# Patient Record
Sex: Female | Born: 1975 | Race: White | Hispanic: No | Smoking: Former smoker
Health system: Southern US, Community
[De-identification: ages and names within clinical notes are randomized; demographics above are authoritative.]

## PROBLEM LIST (undated history)

## (undated) DIAGNOSIS — E079 Disorder of thyroid, unspecified: Secondary | ICD-10-CM

## (undated) DIAGNOSIS — K5790 Diverticulosis of intestine, part unspecified, without perforation or abscess without bleeding: Secondary | ICD-10-CM

## (undated) DIAGNOSIS — Z5189 Encounter for other specified aftercare: Secondary | ICD-10-CM

## (undated) HISTORY — PX: CHOLECYSTECTOMY: SHX55

---

## 2019-08-15 ENCOUNTER — Other Ambulatory Visit: Payer: Self-pay

## 2019-08-15 ENCOUNTER — Encounter (HOSPITAL_COMMUNITY): Payer: Self-pay | Admitting: Emergency Medicine

## 2019-08-15 DIAGNOSIS — R109 Unspecified abdominal pain: Secondary | ICD-10-CM | POA: Insufficient documentation

## 2019-08-15 DIAGNOSIS — Z5321 Procedure and treatment not carried out due to patient leaving prior to being seen by health care provider: Secondary | ICD-10-CM | POA: Diagnosis not present

## 2019-08-15 NOTE — ED Triage Notes (Addendum)
Patient c/o left side abdominal pain today. Reports pain radiating to lower back. Reports nausea, denies V/D. Denies urinary sx. Reports hx fibroid on left ovary.

## 2019-08-16 ENCOUNTER — Emergency Department (HOSPITAL_COMMUNITY)
Admission: EM | Admit: 2019-08-16 | Discharge: 2019-08-16 | Disposition: A | Discharge status: Home / Self Care | Payer: 59 | Attending: Emergency Medicine | Admitting: Emergency Medicine

## 2019-08-16 HISTORY — DX: Encounter for other specified aftercare: Z51.89

## 2019-08-16 HISTORY — DX: Diverticulosis of intestine, part unspecified, without perforation or abscess without bleeding: K57.90

## 2019-08-16 HISTORY — DX: Disorder of thyroid, unspecified: E07.9

## 2019-08-16 NOTE — ED Notes (Signed)
Pt states that she is going to [facility] because, " this is ridiculous."

## 2020-04-03 ENCOUNTER — Other Ambulatory Visit: Payer: Self-pay

## 2020-04-03 ENCOUNTER — Encounter (HOSPITAL_COMMUNITY): Payer: Self-pay | Admitting: Emergency Medicine

## 2020-04-03 ENCOUNTER — Emergency Department (HOSPITAL_COMMUNITY)
Admission: EM | Admit: 2020-04-03 | Discharge: 2020-04-03 | Disposition: A | Discharge status: Home / Self Care | Payer: 59 | Attending: Emergency Medicine | Admitting: Emergency Medicine

## 2020-04-03 ENCOUNTER — Emergency Department (HOSPITAL_COMMUNITY): Payer: 59

## 2020-04-03 DIAGNOSIS — R072 Precordial pain: Secondary | ICD-10-CM

## 2020-04-03 DIAGNOSIS — R2243 Localized swelling, mass and lump, lower limb, bilateral: Secondary | ICD-10-CM | POA: Insufficient documentation

## 2020-04-03 DIAGNOSIS — F17290 Nicotine dependence, other tobacco product, uncomplicated: Secondary | ICD-10-CM | POA: Insufficient documentation

## 2020-04-03 DIAGNOSIS — E079 Disorder of thyroid, unspecified: Secondary | ICD-10-CM | POA: Diagnosis not present

## 2020-04-03 DIAGNOSIS — Z923 Personal history of irradiation: Secondary | ICD-10-CM | POA: Diagnosis not present

## 2020-04-03 DIAGNOSIS — M542 Cervicalgia: Secondary | ICD-10-CM | POA: Diagnosis not present

## 2020-04-03 DIAGNOSIS — R0789 Other chest pain: Secondary | ICD-10-CM | POA: Insufficient documentation

## 2020-04-03 DIAGNOSIS — M79602 Pain in left arm: Secondary | ICD-10-CM | POA: Insufficient documentation

## 2020-04-03 LAB — COMPREHENSIVE METABOLIC PANEL
ALT: 12 U/L (ref 0–44)
AST: 21 U/L (ref 15–41)
Albumin: 3.4 g/dL — ABNORMAL LOW (ref 3.5–5.0)
Alkaline Phosphatase: 58 U/L (ref 38–126)
Anion gap: 8 (ref 5–15)
BUN: 7 mg/dL (ref 6–20)
CO2: 22 mmol/L (ref 22–32)
Calcium: 8.3 mg/dL — ABNORMAL LOW (ref 8.9–10.3)
Chloride: 109 mmol/L (ref 98–111)
Creatinine, Ser: 0.65 mg/dL (ref 0.44–1.00)
GFR calc Af Amer: >60 mL/min (ref >60)
GFR calc non Af Amer: >60 mL/min (ref >60)
Glucose, Bld: 101 mg/dL — ABNORMAL HIGH (ref 70–99)
Potassium: 3.6 mmol/L (ref 3.5–5.1)
Sodium: 139 mmol/L (ref 135–145)
Total Bilirubin: 0.4 mg/dL (ref 0.3–1.2)
Total Protein: 6.1 g/dL — ABNORMAL LOW (ref 6.5–8.1)

## 2020-04-03 LAB — CBC WITH DIFFERENTIAL/PLATELET
Abs Immature Granulocytes: 0.02 10*3/uL (ref 0.00–0.07)
Basophils Absolute: 0 10*3/uL (ref 0.0–0.1)
Basophils Relative: 0 %
Eosinophils Absolute: 0.1 10*3/uL (ref 0.0–0.5)
Eosinophils Relative: 1 %
HCT: 37 % (ref 36.0–46.0)
Hemoglobin: 11.5 g/dL — ABNORMAL LOW (ref 12.0–15.0)
Immature Granulocytes: 0 %
Lymphocytes Relative: 14 %
Lymphs Abs: 0.9 10*3/uL (ref 0.7–4.0)
MCH: 26.8 pg (ref 26.0–34.0)
MCHC: 31.1 g/dL (ref 30.0–36.0)
MCV: 86.2 fL (ref 80.0–100.0)
Monocytes Absolute: 0.3 10*3/uL (ref 0.1–1.0)
Monocytes Relative: 4 %
Neutro Abs: 5.2 10*3/uL (ref 1.7–7.7)
Neutrophils Relative %: 81 %
Platelets: 262 10*3/uL (ref 150–400)
RBC: 4.29 MIL/uL (ref 3.87–5.11)
RDW: 14 % (ref 11.5–15.5)
WBC: 6.5 10*3/uL (ref 4.0–10.5)
nRBC: 0 % (ref 0.0–0.2)

## 2020-04-03 LAB — TROPONIN I (HIGH SENSITIVITY)
Troponin I (High Sensitivity): 3 ng/L (ref <18)
Troponin I (High Sensitivity): 7 ng/L (ref <18)

## 2020-04-03 LAB — D-DIMER, QUANTITATIVE: D-Dimer, Quant: 0.36 ug/mL-FEU (ref 0.00–0.50)

## 2020-04-03 LAB — I-STAT BETA HCG BLOOD, ED (MC, WL, AP ONLY): I-stat hCG, quantitative: <5 m[IU]/mL (ref <5)

## 2020-04-03 MED ORDER — OMEPRAZOLE 20 MG PO CPDR: 20.0000 mg | DELAYED_RELEASE_CAPSULE | Freq: Every day | ORAL | 0 refills | Status: DC

## 2020-04-03 MED ORDER — NITROGLYCERIN 0.4 MG SL SUBL
0.4000 mg | SUBLINGUAL_TABLET | SUBLINGUAL | Status: DC | PRN
  Administered 2020-04-03: 11:00:00 0.4 mg via SUBLINGUAL
  Filled 2020-04-03: qty 1

## 2020-04-03 NOTE — ED Notes (Signed)
Patient verbalizes understanding of discharge instructions. Opportunity for questioning and answers were provided. Armband removed by staff, pt discharged from ED ambulatory.   

## 2020-04-03 NOTE — ED Triage Notes (Signed)
To ED via [facility] from work, developed chest pain - across chest, radiating to neck,  Received ASA 324mg  and NTG 1 SL per EMS--  States she had episode of "sweating" Sunday night and Monday morning. Had Phizer vaccine a week ago Monday--  Hx of hyperthyroidism with radiation--

## 2020-04-03 NOTE — ED Provider Notes (Signed)
[facility] EMERGENCY DEPARTMENT Provider Note   CSN: [id number] Arrival date & time: 04/03/20  1016     History Chief Complaint  Patient presents with  . Chest Pain    Alice Greene is a 44 y.o. female.  The history is provided by the patient. No language interpreter was used.  Chest Pain  Alice Greene is a 44 y.o. female who presents to the Emergency Department complaining of chest pain. She presents the emergency department by EMS complaining of central chest pain that began upon waking around six this morning. Pain is described as a constant pressure type sensation that radiates to her left arm in her neck. She thought it might've been indigestion. She has associated diaphoresis. She also woke up about five days ago covered in sweat. She reports increased bilateral lower extremity edema for the last few days. No reports of fever, nausea, vomiting, abdominal pain, diarrhea, shortness of breath. She attempted to go to work with her chest pain but the pain persisted and EMS was called. She received 324 of aspirin as well as one nitroglycerin prior to ED arrival. She states that she did have partial improvement in her symptoms with the aspirin. She has a history of thyroid disease, status post radiation. She does not take any current medications. She vape's. No alcohol or drug use. She has a family history of cardiac disease and her mother at a young age. No personal or family history of blood clots.    Past Medical History:  Diagnosis Date  . Blood transfusion without reported diagnosis   . Diverticulosis   . Thyroid disease   graves disease, cardiomyopathy  There are no problems to display for this patient.      OB History   No obstetric history on file.     No family history on file.  Social History   Tobacco Use  . Smoking status: Former Research scientist (life sciences)  . Smokeless tobacco: Never Used  Substance Use Topics  . Alcohol use: Not Currently  .  Drug use: Never    Home Medications Prior to Admission medications   Medication Sig Start Date End Date Taking? Authorizing Provider  ibuprofen (ADVIL) 200 MG tablet Take 800 mg by mouth every 6 (six) hours as needed for headache or moderate pain.   Yes [provider]  omeprazole (PRILOSEC) 20 MG capsule Take 1 capsule (20 mg total) by mouth daily. 04/03/20   Quintella Reichert, MD    Allergies    Hydrocodone, Propoxyphene, and Codeine  Review of Systems   Review of Systems  Cardiovascular: Positive for chest pain.  All other systems reviewed and are negative.   Physical Exam Updated Vital Signs BP 117/79   Pulse (!) 51   Temp 98.4 F (36.9 C) (Oral)   Resp 15   Ht 5\' 3"  (1.6 m)   Wt 58.1 kg   LMP 03/28/2020 (Exact Date)   SpO2 100%   BMI 22.67 kg/m   Physical Exam Vitals and nursing note reviewed.  Constitutional:      Appearance: She is well-developed.  HENT:     Head: Normocephalic and atraumatic.  Cardiovascular:     Rate and Rhythm: Normal rate and regular rhythm.     Heart sounds: No murmur.  Pulmonary:     Effort: Pulmonary effort is normal. No respiratory distress.     Breath sounds: Normal breath sounds.  Abdominal:     Palpations: Abdomen is soft.     Tenderness:  There is no abdominal tenderness. There is no guarding or rebound.  Musculoskeletal:        General: No swelling or tenderness.  Skin:    General: Skin is warm and dry.  Neurological:     Mental Status: She is alert and oriented to person, place, and time.  Psychiatric:        Behavior: Behavior normal.     ED Results / Procedures / Treatments   Labs (all labs ordered are listed, but only abnormal results are displayed) Labs Reviewed  COMPREHENSIVE METABOLIC PANEL - Abnormal; Notable for the following components:      Result Value   Glucose, Bld 101 (*)    Calcium 8.3 (*)    Total Protein 6.1 (*)    Albumin 3.4 (*)    All other components within normal limits  CBC WITH  DIFFERENTIAL/PLATELET - Abnormal; Notable for the following components:   Hemoglobin 11.5 (*)    All other components within normal limits  D-DIMER, QUANTITATIVE (NOT AT [facility])  I-STAT BETA HCG BLOOD, ED ([facility], [facility], [facility] ONLY)  TROPONIN I (HIGH SENSITIVITY)  TROPONIN I (HIGH SENSITIVITY)    EKG EKG Interpretation  Date/Time:  Friday April 03 2020 10:18:18 EDT Ventricular Rate:  66 PR Interval:    QRS Duration: 97 QT Interval:  402 QTC Calculation: 422 R Axis:   69 Text Interpretation: Sinus rhythm RSR' in V1 or V2, probably normal variant Nonspecific T abnrm, anterolateral leads no prior available for comparison Confirmed by Quintella Reichert [id number]) on 04/03/2020 10:29:53 AM   Radiology DG Chest 2 View  Result Date: 04/03/2020 CLINICAL DATA:  Chest pain EXAM: CHEST - 2 VIEW COMPARISON:  None. FINDINGS: The heart size and mediastinal contours are within normal limits. Both lungs are clear. The visualized skeletal structures are unremarkable. IMPRESSION: No active cardiopulmonary disease. Electronically Signed   By: Davina Poke D.O.   On: 04/03/2020 11:07    Procedures Procedures (including critical care time)  Medications Ordered in ED Medications  nitroGLYCERIN (NITROSTAT) SL tablet 0.4 mg (0.4 mg Sublingual Given 04/03/20 1101)    ED Course  I have reviewed the triage vital signs and the nursing notes.  Pertinent labs & imaging results that were available during my care of the patient were reviewed by me and considered in my medical decision making (see chart for details).    MDM Rules/Calculators/A&P                     Patient here for evaluation of chest pressure that started today. She has a family history of coronary artery disease. EKG without acute ischemic changes in troponin is negative times two. Presentation is not consistent with PE, D dimer is negative. Doubt dissection, pneumonia. Patient is feeling improved on recheck. Discussed with Dr. Terrence Dupont, with  cardiology. Plan to have her follow-up on Monday for further cardiac evaluation given her risk factors. Counseled patient on home care for chest pain, outpatient follow-up and return precautions.  Final Clinical Impression(s) / ED Diagnoses Final diagnoses:  Precordial pain    Rx / DC Orders ED Discharge Orders         Ordered    omeprazole (PRILOSEC) 20 MG capsule  Daily     04/03/20 1331           Quintella Reichert, MD 04/03/20 1334

## 2021-07-09 ENCOUNTER — Ambulatory Visit
Admission: EM | Admit: 2021-07-09 | Discharge: 2021-07-09 | Disposition: A | Discharge status: Home / Self Care | Payer: 59 | Attending: Emergency Medicine | Admitting: Emergency Medicine

## 2021-07-09 ENCOUNTER — Encounter: Payer: Self-pay | Admitting: Emergency Medicine

## 2021-07-09 ENCOUNTER — Other Ambulatory Visit: Payer: Self-pay

## 2021-07-09 DIAGNOSIS — R52 Pain, unspecified: Secondary | ICD-10-CM

## 2021-07-09 DIAGNOSIS — J069 Acute upper respiratory infection, unspecified: Secondary | ICD-10-CM

## 2021-07-09 MED ORDER — BENZONATATE 200 MG PO CAPS: 200.0000 mg | ORAL_CAPSULE | Freq: Three times a day (TID) | ORAL | 0 refills | Status: AC | PRN

## 2021-07-09 MED ORDER — FLUTICASONE PROPIONATE 50 MCG/ACT NA SUSP: 1.0000 | Freq: Every day | NASAL | 0 refills | Status: DC

## 2021-07-09 MED ORDER — IBUPROFEN 800 MG PO TABS: 800.0000 mg | ORAL_TABLET | Freq: Three times a day (TID) | ORAL | 0 refills | Status: DC

## 2021-07-09 NOTE — ED Triage Notes (Signed)
For a few days pt has had body aches, headache, and eye drainage.  Had fever today.

## 2021-07-09 NOTE — Discharge Instructions (Addendum)
COVID/flu test pending Flonase to help with sinus pressure/inflammation May use over-the-counter Mucinex or Sudafed to further relieve congestion/pressure Continue Tylenol and ibuprofen for headaches, body aches, fevers Rest and fluids Developing more of cough May use Tessalon or over-the-counter Delsym or Robitussin, Dimetapp Follow-up if not improving or worsening

## 2021-07-09 NOTE — ED Provider Notes (Signed)
[facility]    CSN: [id number] Arrival date & time: 07/09/21  1512      History   Chief Complaint Chief Complaint  Patient presents with   URI    HPI Alice Greene is a 45 y.o. female presenting today for evaluation of URI symptoms.  Reports associated fevers, headaches, body aches as well as eye drainage.  Symptoms began over the past 2 to 3 days.  No known COVID exposure.  Using OTC meds without relief.  Reports eyes slightly swollen and draining yesterday, but denies significant eye symptoms today.  Denies change in vision.  Increased headaches can frontal area with sinus pressure.  HPI  Past Medical History:  Diagnosis Date   Blood transfusion without reported diagnosis    Diverticulosis    Thyroid disease     There are no problems to display for this patient.   Past Surgical History:  Procedure Laterality Date   CHOLECYSTECTOMY      OB History   No obstetric history on file.      Home Medications    Prior to Admission medications   Medication Sig Start Date End Date Taking? Authorizing Provider  benzonatate (TESSALON) 200 MG capsule Take 1 capsule (200 mg total) by mouth 3 (three) times daily as needed for up to 7 days for cough. 07/09/21 07/16/21 Yes Telicia Hodgkiss C, PA-C  fluticasone (FLONASE) 50 MCG/ACT nasal spray Place 1-2 sprays into both nostrils daily. 07/09/21  Yes Marquavion Venhuizen C, PA-C  ibuprofen (ADVIL) 800 MG tablet Take 1 tablet (800 mg total) by mouth 3 (three) times daily. 07/09/21  Yes Suhaila Troiano C, PA-C  omeprazole (PRILOSEC) 20 MG capsule Take 1 capsule (20 mg total) by mouth daily. 04/03/20   Quintella Reichert, MD    Family History No family history on file.  Social History Social History   Tobacco Use   Smoking status: Former   Smokeless tobacco: Never  Scientific laboratory technician Use: Every day   Substances: Nicotine, Flavoring  Substance Use Topics   Alcohol use: Not Currently   Drug use: Never     Allergies    Hydrocodone, Propoxyphene, and Codeine   Review of Systems Review of Systems  Constitutional:  Positive for fatigue and fever. Negative for activity change, appetite change and chills.  HENT:  Positive for congestion. Negative for ear pain, rhinorrhea, sinus pressure, sore throat and trouble swallowing.   Eyes:  Positive for discharge. Negative for redness.  Respiratory:  Positive for cough. Negative for chest tightness and shortness of breath.   Cardiovascular:  Negative for chest pain.  Gastrointestinal:  Negative for abdominal pain, diarrhea, nausea and vomiting.  Musculoskeletal:  Negative for myalgias.  Skin:  Negative for rash.  Neurological:  Positive for headaches. Negative for dizziness and light-headedness.    Physical Exam Triage Vital Signs ED Triage Vitals  Enc Vitals Group     BP      Pulse      Resp      Temp      Temp src      SpO2      Weight      Height      Head Circumference      Peak Flow      Pain Score      Pain Loc      Pain Edu?      Excl. in [place]?    No data found.  Updated Vital Signs BP Marland Kitchen)  141/84 (BP Location: Left Arm)   Pulse 65   Temp 98.2 F (36.8 C) (Oral)   Resp 18   Wt 135 lb (61.2 kg)   LMP 06/29/2021   SpO2 99%   BMI 23.91 kg/m   Visual Acuity Right Eye Distance:   Left Eye Distance:   Bilateral Distance:    Right Eye Near:   Left Eye Near:    Bilateral Near:     Physical Exam Vitals and nursing note reviewed.  Constitutional:      Appearance: She is well-developed.     Comments: No acute distress  HENT:     Head: Normocephalic and atraumatic.     Ears:     Comments: Bilateral ears without tenderness to palpation of external auricle, tragus and mastoid, EAC's without erythema or swelling, TM's with good bony landmarks and cone of light. Non erythematous.      Nose: Nose normal.     Mouth/Throat:     Comments: Oral mucosa pink and moist, no tonsillar enlargement or exudate. Posterior pharynx patent and  nonerythematous, no uvula deviation or swelling. Normal phonation.  Eyes:     Conjunctiva/sclera: Conjunctivae normal.  Cardiovascular:     Rate and Rhythm: Normal rate.  Pulmonary:     Effort: Pulmonary effort is normal. No respiratory distress.     Comments: Breathing comfortably at rest, CTABL, no wheezing, rales or other adventitious sounds auscultated  Abdominal:     General: There is no distension.  Musculoskeletal:        General: Normal range of motion.     Cervical back: Neck supple.  Skin:    General: Skin is warm and dry.  Neurological:     Mental Status: She is alert and oriented to person, place, and time.     UC Treatments / Results  Labs (all labs ordered are listed, but only abnormal results are displayed) Labs Reviewed  COVID-19, FLU A+B NAA   Narrative:    Performed at:  [address] [place], [place]  [id number] Lab Director: Rush Farmer MD, Phone:  [phone]    EKG   Radiology No results found.  Procedures Procedures (including critical care time)  Medications Ordered in UC Medications - No data to display  Initial Impression / Assessment and Plan / UC Course  I have reviewed the triage vital signs and the nursing notes.  Pertinent labs & imaging results that were available during my care of the patient were reviewed by me and considered in my medical decision making (see chart for details).    URI/sinus symptoms x3 days-exam reassuring, vital signs stable, COVID test pending, recommend symptomatic and supportive care rest and fluids with continued close monitoring.Discussed strict return precautions. Patient verbalized understanding and is agreeable with plan.   Final Clinical Impressions(s) / UC Diagnoses   Final diagnoses:  Body aches  Viral URI with cough     Discharge Instructions      COVID/flu test pending Flonase to help with sinus pressure/inflammation May use over-the-counter Mucinex or  Sudafed to further relieve congestion/pressure Continue Tylenol and ibuprofen for headaches, body aches, fevers Rest and fluids Developing more of cough May use Tessalon or over-the-counter Delsym or Robitussin, Dimetapp Follow-up if not improving or worsening     ED Prescriptions     Medication Sig Dispense Auth. Provider   fluticasone (FLONASE) 50 MCG/ACT nasal spray Place 1-2 sprays into both nostrils daily. 16 g Claudia Alvizo C, PA-C   ibuprofen (  ADVIL) 800 MG tablet Take 1 tablet (800 mg total) by mouth 3 (three) times daily. 21 tablet Tod Abrahamsen C, PA-C   benzonatate (TESSALON) 200 MG capsule Take 1 capsule (200 mg total) by mouth 3 (three) times daily as needed for up to 7 days for cough. 28 capsule Jamilex Bohnsack, [place] C, PA-C      PDMP not reviewed this encounter.   Janith Lima, [place] 07/12/21 [phone]

## 2021-07-11 LAB — COVID-19, FLU A+B NAA
Influenza A, NAA: NOT DETECTED
Influenza B, NAA: NOT DETECTED
SARS-CoV-2, NAA: NOT DETECTED

## 2022-02-09 ENCOUNTER — Emergency Department (HOSPITAL_COMMUNITY): Payer: Self-pay

## 2022-02-09 ENCOUNTER — Emergency Department (HOSPITAL_COMMUNITY)
Admission: EM | Admit: 2022-02-09 | Discharge: 2022-02-09 | Disposition: A | Discharge status: Home / Self Care | Payer: Self-pay | Attending: Emergency Medicine | Admitting: Emergency Medicine

## 2022-02-09 ENCOUNTER — Other Ambulatory Visit: Payer: Self-pay

## 2022-02-09 DIAGNOSIS — N200 Calculus of kidney: Secondary | ICD-10-CM | POA: Insufficient documentation

## 2022-02-09 DIAGNOSIS — N83202 Unspecified ovarian cyst, left side: Secondary | ICD-10-CM | POA: Insufficient documentation

## 2022-02-09 DIAGNOSIS — D369 Benign neoplasm, unspecified site: Secondary | ICD-10-CM

## 2022-02-09 LAB — CBC WITH DIFFERENTIAL/PLATELET
Abs Immature Granulocytes: 0.02 10*3/uL (ref 0.00–0.07)
Basophils Absolute: 0 10*3/uL (ref 0.0–0.1)
Basophils Relative: 1 %
Eosinophils Absolute: 0.1 10*3/uL (ref 0.0–0.5)
Eosinophils Relative: 1 %
HCT: 41 % (ref 36.0–46.0)
Hemoglobin: 12.5 g/dL (ref 12.0–15.0)
Immature Granulocytes: 0 %
Lymphocytes Relative: 13 %
Lymphs Abs: 0.9 10*3/uL (ref 0.7–4.0)
MCH: 23.6 pg — ABNORMAL LOW (ref 26.0–34.0)
MCHC: 30.5 g/dL (ref 30.0–36.0)
MCV: 77.4 fL — ABNORMAL LOW (ref 80.0–100.0)
Monocytes Absolute: 0.5 10*3/uL (ref 0.1–1.0)
Monocytes Relative: 7 %
Neutro Abs: 5.5 10*3/uL (ref 1.7–7.7)
Neutrophils Relative %: 78 %
Platelets: 288 10*3/uL (ref 150–400)
RBC: 5.3 MIL/uL — ABNORMAL HIGH (ref 3.87–5.11)
RDW: 17.2 % — ABNORMAL HIGH (ref 11.5–15.5)
WBC: 7.1 10*3/uL (ref 4.0–10.5)
nRBC: 0 % (ref 0.0–0.2)

## 2022-02-09 LAB — COMPREHENSIVE METABOLIC PANEL
ALT: 14 U/L (ref 0–44)
AST: 23 U/L (ref 15–41)
Albumin: 4.1 g/dL (ref 3.5–5.0)
Alkaline Phosphatase: 59 U/L (ref 38–126)
Anion gap: 7 (ref 5–15)
BUN: 12 mg/dL (ref 6–20)
CO2: 25 mmol/L (ref 22–32)
Calcium: 8.9 mg/dL (ref 8.9–10.3)
Chloride: 105 mmol/L (ref 98–111)
Creatinine, Ser: 0.59 mg/dL (ref 0.44–1.00)
GFR, Estimated: >60 mL/min (ref >60)
Glucose, Bld: 84 mg/dL (ref 70–99)
Potassium: 4 mmol/L (ref 3.5–5.1)
Sodium: 137 mmol/L (ref 135–145)
Total Bilirubin: 0.4 mg/dL (ref 0.3–1.2)
Total Protein: 7.6 g/dL (ref 6.5–8.1)

## 2022-02-09 LAB — URINALYSIS, ROUTINE W REFLEX MICROSCOPIC
Bilirubin Urine: NEGATIVE
Glucose, UA: NEGATIVE mg/dL
Hgb urine dipstick: NEGATIVE
Ketones, ur: NEGATIVE mg/dL
Leukocytes,Ua: NEGATIVE
Nitrite: NEGATIVE
Protein, ur: NEGATIVE mg/dL
Specific Gravity, Urine: 1.018 (ref 1.005–1.030)
pH: 7 (ref 5.0–8.0)

## 2022-02-09 LAB — PREGNANCY, URINE: Preg Test, Ur: NEGATIVE

## 2022-02-09 MED ORDER — MORPHINE SULFATE (PF) 4 MG/ML IV SOLN
4.0000 mg | Freq: Once | INTRAVENOUS | Status: AC
  Administered 2022-02-09: 4 mg via INTRAVENOUS
  Filled 2022-02-09: qty 1

## 2022-02-09 MED ORDER — SODIUM CHLORIDE 0.9 % IV BOLUS
1000.0000 mL | Freq: Once | INTRAVENOUS | Status: AC
  Administered 2022-02-09: 1000 mL via INTRAVENOUS

## 2022-02-09 MED ORDER — IOHEXOL 300 MG/ML  SOLN
100.0000 mL | Freq: Once | INTRAMUSCULAR | Status: AC | PRN
  Administered 2022-02-09: 100 mL via INTRAVENOUS

## 2022-02-09 MED ORDER — ONDANSETRON HCL 4 MG/2ML IJ SOLN
4.0000 mg | Freq: Once | INTRAMUSCULAR | Status: AC
  Administered 2022-02-09: 4 mg via INTRAVENOUS
  Filled 2022-02-09: qty 2

## 2022-02-09 MED ORDER — OXYCODONE-ACETAMINOPHEN 5-325 MG PO TABS: 1.0000 | ORAL_TABLET | Freq: Four times a day (QID) | ORAL | 0 refills | Status: DC | PRN

## 2022-02-09 NOTE — Discharge Instructions (Addendum)
Take Tylenol and Motrin for pain  Take Percocet for severe pain.  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

## 2022-02-09 NOTE — ED Triage Notes (Signed)
Patient c/o LUQ abdominal pain x3 days. Pt report abdominal pain got worsen this afternoon. Denies fever, vomiting and diarrhea. Pt a/xo4.

## 2022-02-09 NOTE — ED Provider Notes (Signed)
[facility] DEPT Provider Note   CSN: [id number] Arrival date & time: 02/09/22  1530     History  Chief Complaint  Patient presents with   Abdominal Pain    Dossie Ocanas is a 46 y.o. female here with abdominal pain. Patient states that she has been having left lower quadrant pain for the last 3 days.  She states that it was intermittent initially and now is constant.  Patient initially had some vaginal discharge that resolved.  Patient states that she had a growth in her ovary that was thought to be benign.  Patient denies any history of cancer in the past.  Patient states that she is nauseated but has no vomiting.  The history is provided by the patient.      Home Medications Prior to Admission medications   Medication Sig Start Date End Date Taking? Authorizing Provider  fluticasone (FLONASE) 50 MCG/ACT nasal spray Place 1-2 sprays into both nostrils daily. 07/09/21   Wieters, Hallie C, PA-C  ibuprofen (ADVIL) 800 MG tablet Take 1 tablet (800 mg total) by mouth 3 (three) times daily. 07/09/21   Wieters, Hallie C, PA-C  omeprazole (PRILOSEC) 20 MG capsule Take 1 capsule (20 mg total) by mouth daily. 04/03/20   Quintella Reichert, MD      Allergies    Hydrocodone, Propoxyphene, and Codeine    Review of Systems   Review of Systems  Gastrointestinal:  Positive for abdominal pain and vomiting.  All other systems reviewed and are negative.  Physical Exam Updated Vital Signs BP (!) 141/80    Pulse (!) 57    Temp 98.3 F (36.8 C) (Oral)    Resp 16    SpO2 96%  Physical Exam Vitals and nursing note reviewed.  HENT:     Head: Normocephalic.     Mouth/Throat:     Mouth: Mucous membranes are moist.  Eyes:     Extraocular Movements: Extraocular movements intact.     Pupils: Pupils are equal, round, and reactive to light.  Cardiovascular:     Rate and Rhythm: Normal rate and regular rhythm.  Pulmonary:     Effort: Pulmonary effort is normal.      Breath sounds: Normal breath sounds.  Abdominal:     General: Abdomen is flat.     Comments: LLQ tenderness   Skin:    General: Skin is warm.     Capillary Refill: Capillary refill takes less than 2 seconds.  Neurological:     General: No focal deficit present.     Mental Status: She is oriented to person, place, and time.  Psychiatric:        Mood and Affect: Mood normal.        Behavior: Behavior normal.    ED Results / Procedures / Treatments   Labs (all labs ordered are listed, but only abnormal results are displayed) Labs Reviewed  CBC WITH DIFFERENTIAL/PLATELET - Abnormal; Notable for the following components:      Result Value   RBC 5.30 (*)    MCV 77.4 (*)    MCH 23.6 (*)    RDW 17.2 (*)    All other components within normal limits  URINALYSIS, ROUTINE W REFLEX MICROSCOPIC - Abnormal; Notable for the following components:   Color, Urine AMBER (*)    APPearance CLOUDY (*)    All other components within normal limits  COMPREHENSIVE METABOLIC PANEL  PREGNANCY, URINE    EKG None  Radiology US Transvaginal Non-OB  Result Date: 02/09/2022 CLINICAL DATA:  Left lower quadrant pain. Evaluate for mass or torsion. Last menstrual period 02/02/2022 EXAM: TRANSABDOMINAL AND TRANSVAGINAL ULTRASOUND OF PELVIS DOPPLER ULTRASOUND OF OVARIES TECHNIQUE: Both transabdominal and transvaginal ultrasound examinations of the pelvis were performed. Transabdominal technique was performed for global imaging of the pelvis including uterus, ovaries, adnexal regions, and pelvic cul-de-sac. It was necessary to proceed with endovaginal exam following the transabdominal exam to visualize the uterus endometrium. Color and duplex Doppler ultrasound was utilized to evaluate blood flow to the ovaries. COMPARISON:  None. FINDINGS: Uterus Measurements: 7.9 x 4.7 x 5.7 cm = volume: 110 mL. No fibroids or other mass visualized. Endometrium Thickness: 7 mm.  No focal abnormality visualized. Right ovary  Measurements: 3.9 x 2.6 x 3.1 cm = volume: 16.6 mL. Normal appearance/no adnexal mass. Left ovary Measurements: 4.0 x 2.7 x 4.7 cm = volume: 26 mL. There are heterogeneous hyperechoic and hypoechoic regions which may represent complex cysts. One hypoechoic region measuring up to 3.0 cm may represent a fat containing structure such as a dermoid or hemorrhagic cyst. No definite posterior attenuation is seen to definitely indicate sebaceous material. Pulsed Doppler evaluation of both ovaries demonstrates normal low-resistance arterial and venous waveforms. Other findings No abnormal free fluid. IMPRESSION: 1. Normal appearance of the uterus and endometrium. 2. Normal blood flow bilateral ovaries. 3. The left ovary is normal in size but contains heterogeneous hyperechoic and hypoechoic areas. No definitive mass is seen, however there is a hypoechoic region for which differential considerations include a hemorrhagic cyst or possible fat containing structure such as a small dermoid. Electronically Signed   By: Yvonne Kendall M.D.   On: 02/09/2022 17:15   US Pelvis Complete  Result Date: 02/09/2022 CLINICAL DATA:  Left lower quadrant pain. Evaluate for mass or torsion. Last menstrual period 02/02/2022 EXAM: TRANSABDOMINAL AND TRANSVAGINAL ULTRASOUND OF PELVIS DOPPLER ULTRASOUND OF OVARIES TECHNIQUE: Both transabdominal and transvaginal ultrasound examinations of the pelvis were performed. Transabdominal technique was performed for global imaging of the pelvis including uterus, ovaries, adnexal regions, and pelvic cul-de-sac. It was necessary to proceed with endovaginal exam following the transabdominal exam to visualize the uterus endometrium. Color and duplex Doppler ultrasound was utilized to evaluate blood flow to the ovaries. COMPARISON:  None. FINDINGS: Uterus Measurements: 7.9 x 4.7 x 5.7 cm = volume: 110 mL. No fibroids or other mass visualized. Endometrium Thickness: 7 mm.  No focal abnormality visualized. Right  ovary Measurements: 3.9 x 2.6 x 3.1 cm = volume: 16.6 mL. Normal appearance/no adnexal mass. Left ovary Measurements: 4.0 x 2.7 x 4.7 cm = volume: 26 mL. There are heterogeneous hyperechoic and hypoechoic regions which may represent complex cysts. One hypoechoic region measuring up to 3.0 cm may represent a fat containing structure such as a dermoid or hemorrhagic cyst. No definite posterior attenuation is seen to definitely indicate sebaceous material. Pulsed Doppler evaluation of both ovaries demonstrates normal low-resistance arterial and venous waveforms. Other findings No abnormal free fluid. IMPRESSION: 1. Normal appearance of the uterus and endometrium. 2. Normal blood flow bilateral ovaries. 3. The left ovary is normal in size but contains heterogeneous hyperechoic and hypoechoic areas. No definitive mass is seen, however there is a hypoechoic region for which differential considerations include a hemorrhagic cyst or possible fat containing structure such as a small dermoid. Electronically Signed   By: Yvonne Kendall M.D.   On: 02/09/2022 17:15   CT ABDOMEN PELVIS W CONTRAST  Result Date: 02/09/2022 CLINICAL DATA:  Left lower  quadrant pain EXAM: CT ABDOMEN AND PELVIS WITH CONTRAST TECHNIQUE: Multidetector CT imaging of the abdomen and pelvis was performed using the standard protocol following bolus administration of intravenous contrast. RADIATION DOSE REDUCTION: This exam was performed according to the departmental dose-optimization program which includes automated exposure control, adjustment of the mA and/or kV according to patient size and/or use of iterative reconstruction technique. CONTRAST:  183mL OMNIPAQUE IOHEXOL 300 MG/ML  SOLN COMPARISON:  Ultrasound 02/09/2022 FINDINGS: Lower chest: No acute abnormality. Hepatobiliary: Status post cholecystectomy. Intra and extrahepatic biliary dilatation, extrahepatic common bile duct measures up to 12 mm. Pancreas: Unremarkable. No pancreatic ductal  dilatation or surrounding inflammatory changes. Spleen: Normal in size without focal abnormality. Adrenals/Urinary Tract: Adrenal glands are normal. Cyst mid pole right kidney. No hydronephrosis. Two small stones lower pole left kidney measuring up to 5 mm in size. Urinary bladder unremarkable Stomach/Bowel: Stomach is within normal limits. Appendix appears normal. No evidence of bowel wall thickening, distention, or inflammatory changes. Vascular/Lymphatic: No significant vascular findings are present. No enlarged abdominal or pelvic lymph nodes. Mild atherosclerosis. Reproductive: Uterus shows nabothian cysts. 3.3 by 2.6 cm fat containing lesion in the left adnexa with fluid level consistent with dermoid, corresponds to the ultrasound mass. Other: Negative for pelvic effusion or free air. Musculoskeletal: No acute or significant osseous findings. IMPRESSION: 1. No CT evidence for acute intra-abdominal or pelvic abnormality. 2. 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 3. Several nonobstructing stones in the left kidney Electronically Signed   By: Donavan Foil M.D.   On: 02/09/2022 18:10   US Renal  Result Date: 02/09/2022 CLINICAL DATA:  Left flank pain EXAM: RENAL / URINARY TRACT ULTRASOUND COMPLETE COMPARISON:  None. FINDINGS: Right Kidney: Renal measurements: 12.1 x 3.7 x 4.2 cm = volume: 98.02 mL. There is no hydronephrosis. There is 1.3 cm cyst in the upper pole. Left Kidney: Renal measurements: 9.9 x 4.6 x 5 cm = volume: 118 mL. Echogenicity within normal limits. No mass or hydronephrosis visualized. Bladder: Urinary bladder is empty and not adequately evaluated. Other: None. IMPRESSION: There is no hydronephrosis.  There is 1.3 cm right renal cyst. Electronically Signed   By: Elmer Picker M.D.   On: 02/09/2022 17:10   [place] Art/Ven Flow Abd Pelv Doppler  Result Date: 02/09/2022 CLINICAL DATA:  Left lower quadrant pain. Evaluate for mass or torsion. Last menstrual  period 02/02/2022 EXAM: TRANSABDOMINAL AND TRANSVAGINAL ULTRASOUND OF PELVIS DOPPLER ULTRASOUND OF OVARIES TECHNIQUE: Both transabdominal and transvaginal ultrasound examinations of the pelvis were performed. Transabdominal technique was performed for global imaging of the pelvis including uterus, ovaries, adnexal regions, and pelvic cul-de-sac. It was necessary to proceed with endovaginal exam following the transabdominal exam to visualize the uterus endometrium. Color and duplex Doppler ultrasound was utilized to evaluate blood flow to the ovaries. COMPARISON:  None. FINDINGS: Uterus Measurements: 7.9 x 4.7 x 5.7 cm = volume: 110 mL. No fibroids or other mass visualized. Endometrium Thickness: 7 mm.  No focal abnormality visualized. Right ovary Measurements: 3.9 x 2.6 x 3.1 cm = volume: 16.6 mL. Normal appearance/no adnexal mass. Left ovary Measurements: 4.0 x 2.7 x 4.7 cm = volume: 26 mL. There are heterogeneous hyperechoic and hypoechoic regions which may represent complex cysts. One hypoechoic region measuring up to 3.0 cm may represent a fat containing structure such as a dermoid or hemorrhagic cyst. No definite posterior attenuation is seen to definitely indicate sebaceous material. Pulsed Doppler evaluation of both ovaries demonstrates normal low-resistance arterial  and venous waveforms. Other findings No abnormal free fluid. IMPRESSION: 1. Normal appearance of the uterus and endometrium. 2. Normal blood flow bilateral ovaries. 3. The left ovary is normal in size but contains heterogeneous hyperechoic and hypoechoic areas. No definitive mass is seen, however there is a hypoechoic region for which differential considerations include a hemorrhagic cyst or possible fat containing structure such as a small dermoid. Electronically Signed   By: Yvonne Kendall M.D.   On: 02/09/2022 17:15    Procedures Procedures    Medications Ordered in ED Medications  sodium chloride 0.9 % bolus 1,000 mL (1,000 mLs  Intravenous New Bag/Given 02/09/22 1659)  morphine (PF) 4 MG/ML injection 4 mg (4 mg Intravenous Given 02/09/22 1659)  ondansetron (ZOFRAN) injection 4 mg (4 mg Intravenous Given 02/09/22 1659)  iohexol (OMNIPAQUE) 300 MG/ML solution 100 mL (100 mLs Intravenous Contrast Given 02/09/22 1755)    ED Course/ Medical Decision Making/ A&P                           Medical Decision Making Beyonce Sawatzky is a 46 y.o. female here with LLQ.  Consider torsion versus pain from ovarian cyst versus renal colic.  Plan to get CBC and CMP and renal ultrasound and transvaginal ultrasound.  Will give pain medicine as well  6:26 PM Patient's labs and UA unremarkable.  Ultrasound showed 3.6 cm left ovarian cyst likely dermoid cyst.  Patient initially had some pain so CT was performed and showed several nonobstructing left kidney stone with no hydro.  UA did not show any infection.  Pain is now under control.  I think the pain is likely from the dermoid cyst and since patient has no hydro, I do not think she has renal colic causing her pain. At this point we will discharge patient home with pain medicine, GYN follow up   Problems Addressed: Dermoid cyst: acute illness or injury Kidney stone: acute illness or injury  Amount and/or Complexity of Data Reviewed External Data Reviewed: notes. Labs: ordered. Decision-making details documented in ED Course. Radiology: ordered and independent interpretation performed. Decision-making details documented in ED Course.  Risk Prescription drug management.   Final Clinical Impression(s) / ED Diagnoses Final diagnoses:  None    Rx / DC Orders ED Discharge Orders     None         Drenda Freeze, MD 02/09/22 [phone]

## 2022-02-09 NOTE — ED Notes (Signed)
Patient transported to CT 

## 2022-04-10 ENCOUNTER — Other Ambulatory Visit: Payer: Self-pay

## 2022-04-10 ENCOUNTER — Emergency Department (HOSPITAL_COMMUNITY)
Admission: EM | Admit: 2022-04-10 | Discharge: 2022-04-11 | Disposition: A | Discharge status: Home / Self Care | Payer: Self-pay | Attending: Emergency Medicine | Admitting: Emergency Medicine

## 2022-04-10 ENCOUNTER — Encounter (HOSPITAL_COMMUNITY): Payer: Self-pay

## 2022-04-10 DIAGNOSIS — G4489 Other headache syndrome: Secondary | ICD-10-CM | POA: Insufficient documentation

## 2022-04-10 DIAGNOSIS — R202 Paresthesia of skin: Secondary | ICD-10-CM | POA: Insufficient documentation

## 2022-04-10 DIAGNOSIS — Z87891 Personal history of nicotine dependence: Secondary | ICD-10-CM | POA: Insufficient documentation

## 2022-04-10 DIAGNOSIS — Z20822 Contact with and (suspected) exposure to covid-19: Secondary | ICD-10-CM | POA: Insufficient documentation

## 2022-04-10 NOTE — ED Triage Notes (Signed)
Pt to ED by POV from home with c/o L sided weakness, numbness which began at 21:30 this evening. Provider in triage to screen for code stroke. VSS, NADN. ?

## 2022-04-11 ENCOUNTER — Emergency Department (HOSPITAL_COMMUNITY): Payer: Self-pay

## 2022-04-11 ENCOUNTER — Encounter (HOSPITAL_COMMUNITY): Payer: Self-pay

## 2022-04-11 LAB — COMPREHENSIVE METABOLIC PANEL
ALT: 15 U/L (ref 0–44)
AST: 22 U/L (ref 15–41)
Albumin: 4.2 g/dL (ref 3.5–5.0)
Alkaline Phosphatase: 72 U/L (ref 38–126)
Anion gap: 4 — ABNORMAL LOW (ref 5–15)
BUN: 13 mg/dL (ref 6–20)
CO2: 27 mmol/L (ref 22–32)
Calcium: 9 mg/dL (ref 8.9–10.3)
Chloride: 110 mmol/L (ref 98–111)
Creatinine, Ser: 0.57 mg/dL (ref 0.44–1.00)
GFR, Estimated: >60 mL/min (ref >60)
Glucose, Bld: 102 mg/dL — ABNORMAL HIGH (ref 70–99)
Potassium: 3.6 mmol/L (ref 3.5–5.1)
Sodium: 141 mmol/L (ref 135–145)
Total Bilirubin: 0.4 mg/dL (ref 0.3–1.2)
Total Protein: 7.3 g/dL (ref 6.5–8.1)

## 2022-04-11 LAB — PROTIME-INR
INR: 0.9 (ref 0.8–1.2)
Prothrombin Time: 12.2 seconds (ref 11.4–15.2)

## 2022-04-11 LAB — DIFFERENTIAL
Abs Immature Granulocytes: 0.01 10*3/uL (ref 0.00–0.07)
Basophils Absolute: 0 10*3/uL (ref 0.0–0.1)
Basophils Relative: 1 %
Eosinophils Absolute: 0.2 10*3/uL (ref 0.0–0.5)
Eosinophils Relative: 3 %
Immature Granulocytes: 0 %
Lymphocytes Relative: 20 %
Lymphs Abs: 1.4 10*3/uL (ref 0.7–4.0)
Monocytes Absolute: 0.5 10*3/uL (ref 0.1–1.0)
Monocytes Relative: 7 %
Neutro Abs: 4.9 10*3/uL (ref 1.7–7.7)
Neutrophils Relative %: 69 %

## 2022-04-11 LAB — CBC
HCT: 41.3 % (ref 36.0–46.0)
Hemoglobin: 13 g/dL (ref 12.0–15.0)
MCH: 25.5 pg — ABNORMAL LOW (ref 26.0–34.0)
MCHC: 31.5 g/dL (ref 30.0–36.0)
MCV: 81 fL (ref 80.0–100.0)
Platelets: 229 10*3/uL (ref 150–400)
RBC: 5.1 MIL/uL (ref 3.87–5.11)
RDW: 16.5 % — ABNORMAL HIGH (ref 11.5–15.5)
WBC: 7 10*3/uL (ref 4.0–10.5)
nRBC: 0 % (ref 0.0–0.2)

## 2022-04-11 LAB — RESP PANEL BY RT-PCR (FLU A&B, COVID) ARPGX2
Influenza A by PCR: NEGATIVE
Influenza B by PCR: NEGATIVE
SARS Coronavirus 2 by RT PCR: NEGATIVE

## 2022-04-11 LAB — CBG MONITORING, ED: Glucose-Capillary: 109 mg/dL — ABNORMAL HIGH (ref 70–99)

## 2022-04-11 LAB — APTT: aPTT: 23 seconds — ABNORMAL LOW (ref 24–36)

## 2022-04-11 LAB — I-STAT BETA HCG BLOOD, ED (MC, WL, AP ONLY): I-stat hCG, quantitative: <5 m[IU]/mL (ref <5)

## 2022-04-11 LAB — ETHANOL: Alcohol, Ethyl (B): <10 mg/dL (ref <10)

## 2022-04-11 MED ORDER — IOHEXOL 350 MG/ML SOLN
100.0000 mL | Freq: Once | INTRAVENOUS | Status: AC | PRN
  Administered 2022-04-11: 100 mL via INTRAVENOUS

## 2022-04-11 MED ORDER — PROCHLORPERAZINE EDISYLATE 10 MG/2ML IJ SOLN
10.0000 mg | Freq: Once | INTRAMUSCULAR | Status: AC
  Administered 2022-04-11: 10 mg via INTRAVENOUS
  Filled 2022-04-11: qty 2

## 2022-04-11 NOTE — Progress Notes (Signed)
Code Stroke Activated '@0023'$ . Patient back from CT when stroke was activated. mRS 0. LKWT 2130. Patient states that she had left sided arm pain that turned to weakness/numbness that then radiated up her neck to her head. Dr. Ria Bush on stroke cart '@0031'$ . NCCT-results given on camera to Dr. Ria Bush '@0032'$ .  ?

## 2022-04-11 NOTE — ED Notes (Signed)
Code stroke called

## 2022-04-11 NOTE — Consult Note (Signed)
[facility] ?[facility] ? ? ?Patient Name:   Alice Greene, Alice Greene ?Date of Birth:   02-12-1976 ?Date of Service:   04/11/2022 00:28:06 ? ?Diagnosis: ?      R53.1 - Weakness ?      R20.2 - Paresthesia of skin ? ?Impression: ?     46 y/o woman with history of CHF and migraine presenting with left sided numbness and subjective weakness. Not a thrombolytic candidate as her symptoms are non disabling, and also unlikely to be secondary to ischemic stroke. 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 If symptoms do not improve with improving headache, then would admit for stroke rule out with MRI brain. ? ?Our recommendations are outlined below. ? ?Recommendations: ? ?      Stroke/Telemetry Floor ?      Neuro Checks ?      Bedside Swallow Eval ?      DVT Prophylaxis ?      IV Fluids, Normal Saline ?      Head of Bed 30 Degrees ?      Euglycemia and Avoid Hyperthermia (PRN Acetaminophen) ?      Initiate or continue Aspirin 81 MG daily ? ?Per facility request will defer further work up, management, and referrals to inpatient service, inclusive of inpatient neurology consult. ? ?Sign Out: ?      Discussed with Emergency Department Provider ? ? ? ?------------------------------------------------------------------------------ ? ?Advanced Imaging: ?Advanced Imaging Deferred because: ? ?Stroke not suspected with clinical presentation and exam ? ? ?Metrics: ?Last Known Well: 04/10/2022 21:30:00 ?[facility] Notification Time: 04/11/2022 00:28:06 ?Arrival Time: 04/10/2022 23:32:00 ?Stamp Time: 04/11/2022 00:28:06 ?Initial Response Time: 04/11/2022 00:30:33 ?Symptoms: left sided weakness and numbness. ?NIHSS Start Assessment Time: 04/11/2022 00:32:12 ?Patient is not a candidate for Thrombolytic. ?Thrombolytic Medical Decision: 04/11/2022 00:36:15 ?Patient was not deemed candidate for Thrombolytic because of following reasons: ?No disabling symptoms. ? ?CT head showed  no acute hemorrhage or acute core infarct. ? ?Primary Provider Notified of Diagnostic Impression and Management Plan on: 04/11/2022 00:48:10 ? ? ? ?------------------------------------------------------------------------------ ? ?History of Present Illness: ?Patient is a 46 year old Female. ? ?Patient was brought by private transportation with symptoms of left sided weakness and numbness. ?46 y/o woman with history of migraine and CHF presenting with left sided symptoms. Last known normal at around 2130. States that she began to have pain in the left shoulder, then developing into numbness in the left side with facial flushing. Currently complaining of left sided numbing type headache with nausea. ? ? ? ?Past Medical History: ?Othere PMH:  CHF ? Grave's. ? ?Medications: ? ?No Anticoagulant use  ?No Antiplatelet use ?Reviewed EMR for current medications ? ?Allergies:  ?Reviewed ? ?Social History: ?Drug Use: No ? ?Family History: ? ?There is no family history of premature cerebrovascular disease pertinent to this consultation ? ?ROS : ?14 Points Review of Systems was performed and was negative except mentioned in HPI. ? ?Past Surgical History: ?There Is No Surgical History Contributory To Today?s Visit ? ? ? ?Examination: ?BP(140/90), Pulse(65), ?1A: Level of Consciousness - Alert; keenly responsive + 0 ?1B: Ask Month and Age - Both Questions Right + 0 ?1C: Blink Eyes & Squeeze Hands - Performs Both Tasks + 0 ?2: Test Horizontal Extraocular Movements - Normal + 0 ?3: Test Visual Fields - No Visual Loss + 0 ?4: Test Facial Palsy (Use Grimace if Obtunded) - Normal symmetry + 0 ?5A: Test Left Arm Motor Drift - No Drift for 10 Seconds + 0 ?  5B: Test Right Arm Motor Drift - No Drift for 10 Seconds + 0 ?6A: Test Left Leg Motor Drift - No Drift for 5 Seconds + 0 ?6B: Test Right Leg Motor Drift - No Drift for 5 Seconds + 0 ?7: Test Limb Ataxia (FNF/Heel-Shin) - No Ataxia + 0 ?8: Test Sensation - Mild-Moderate Loss: Less  Sharp/More Dull + 1 ?9: Test Language/Aphasia - Normal; No aphasia + 0 ?10: Test Dysarthria - Normal + 0 ?11: Test Extinction/Inattention - No abnormality + 0 ? ?NIHSS Score: 1 ? ? ?Pre-Morbid Modified Rankin Scale: ?0 Points = No symptoms at all ? ? ?Patient/Family was informed the Neurology Consult would occur via TeleHealth consult by way of interactive audio and video telecommunications and consented to receiving care in this manner. ? ? ?Patient is being evaluated for possible acute neurologic impairment and high probability of imminent or life-threatening deterioration. I spent total of 22 minutes providing care to this patient, including time for face to face visit via telemedicine, review of medical records, imaging studies and discussion of findings with providers, the patient and/or family. ? ? ?Dr Shari Prows ? ? ?[facility] ?[phone] ? ? ?Case [id number] ? ?

## 2022-04-11 NOTE — ED Provider Notes (Signed)
?[place] DEPT ?Provider Note ? ? ?CSN: [id number] ?Arrival date & time: 04/10/22  2332 ? ?  ? ?History ? ?Chief Complaint  ?Patient presents with  ? Code Stroke  ? ?Level 5 caveat due to acuity of condition ?Alice Greene is a 46 y.o. female. ? ?The history is provided by the patient.  ?Neurologic Problem ?This is a new problem. The current episode started 1 to 2 hours ago. The problem occurs constantly. The problem has not changed since onset.Associated symptoms include headaches. Pertinent negatives include no chest pain. Nothing aggravates the symptoms. Nothing relieves the symptoms.  ?Patient presents with left-sided numbness and a headache.  Patient reports he is having a headache, neck pain and numbness in her left face left arm and left leg.  No focal weakness.  No slurred speech.  She has never had this before. ?  ?Past Medical History:  ?Diagnosis Date  ? Blood transfusion without reported diagnosis   ? Diverticulosis   ? Thyroid disease   ? ?Social History  ? ?Socioeconomic History  ? Marital status: Divorced  ?  Spouse name: Not on file  ? Number of children: Not on file  ? Years of education: Not on file  ? Highest education level: Not on file  ?Occupational History  ? Not on file  ?Tobacco Use  ? Smoking status: Former  ? Smokeless tobacco: Never  ?Vaping Use  ? Vaping Use: Every day  ? Substances: Nicotine, Flavoring  ?Substance and Sexual Activity  ? Alcohol use: Not Currently  ? Drug use: Never  ? Sexual activity: Not on file  ?Other Topics Concern  ? Not on file  ?Social History Narrative  ? Not on file  ? ?Social Determinants of Health  ? ?Financial Resource Strain: Not on file  ?Food Insecurity: Not on file  ?Transportation Needs: Not on file  ?Physical Activity: Not on file  ?Stress: Not on file  ?Social Connections: Not on file  ?Intimate Partner Violence: Not on file  ? ? ?Home Medications ?Prior to Admission medications   ?Medication Sig Start Date End  Date Taking? Authorizing Provider  ?fluticasone (FLONASE) 50 MCG/ACT nasal spray Place 1-2 sprays into both nostrils daily. 07/09/21   Wieters, Hallie C, PA-C  ?ibuprofen (ADVIL) 800 MG tablet Take 1 tablet (800 mg total) by mouth 3 (three) times daily. 07/09/21   Wieters, Hallie C, PA-C  ?omeprazole (PRILOSEC) 20 MG capsule Take 1 capsule (20 mg total) by mouth daily. 04/03/20   Quintella Reichert, MD  ?   ? ?Allergies    ?Hydrocodone, Propoxyphene, and Codeine   ? ?Review of Systems   ?Review of Systems  ?Unable to perform ROS: Acuity of condition  ?Cardiovascular:  Negative for chest pain.  ?Neurological:  Positive for headaches.  ? ?Physical Exam ?Updated Vital Signs ?BP (!) 154/78   Pulse 60   Temp 98.1 ?F (36.7 ?C) (Oral)   Resp 18   Ht 1.6 m ('5\' 3"'$ )   Wt 64.2 kg   SpO2 97%   BMI 25.08 kg/m?  ?Physical Exam ?CONSTITUTIONAL: Well developed/well nourished ?HEAD: Normocephalic/atraumatic ?EYES: EOMI/PERRL, no nystagmus, n no ptosis ?ENMT: Mucous membranes moist ?NECK: supple no meningeal signs ?CV: S1/S2 noted, no murmurs/rubs/gallops noted ?LUNGS: Lungs are clear to auscultation bilaterally, no apparent distress ?ABDOMEN: soft ?NEURO:Awake/alert, face symmetric, no arm or leg drift is noted ?Equal 5/5 strength with shoulder abduction, elbow flex/extension, wrist flex/extension in upper extremities and equal hand grips bilaterally ?Aside from facial  sensory deficit, all cranial nerves intact ?She reports numbness to left face/arm/leg.   ?NIHSS=1 ?EXTREMITIES: pulses normal, full ROM ?SKIN: warm, color normal ?PSYCH: no abnormalities of mood noted ? ?ED Results / Procedures / Treatments   ?Labs ?(all labs ordered are listed, but only abnormal results are displayed) ?Labs Reviewed  ?APTT - Abnormal; Notable for the following components:  ?    Result Value  ? aPTT 23 (*)   ? All other components within normal limits  ?CBC - Abnormal; Notable for the following components:  ? MCH 25.5 (*)   ? RDW 16.5 (*)   ? All  other components within normal limits  ?COMPREHENSIVE METABOLIC PANEL - Abnormal; Notable for the following components:  ? Glucose, Bld 102 (*)   ? Anion gap 4 (*)   ? All other components within normal limits  ?CBG MONITORING, ED - Abnormal; Notable for the following components:  ? Glucose-Capillary 109 (*)   ? All other components within normal limits  ?RESP PANEL BY RT-PCR (FLU A&B, COVID) ARPGX2  ?ETHANOL  ?PROTIME-INR  ?DIFFERENTIAL  ?I-STAT BETA HCG BLOOD, ED ([facility], [facility], [facility] ONLY)  ? ? ?EKG ?EKG Interpretation ? ?Date/Time:  Monday April 11 2022 00:09:42 EDT ?Ventricular Rate:  67 ?PR Interval:  150 ?QRS Duration: 106 ?QT Interval:  404 ?QTC Calculation: 427 ?R Axis:   66 ?Text Interpretation: Sinus rhythm Consider left atrial enlargement RSR' in V1 or V2, probably normal variant No significant change since last tracing Confirmed by Ripley Fraise [phone]) on 04/11/2022 12:20:49 AM ? ?Radiology ?CT Angio Head W or Wo Contrast ? ?Result Date: 04/11/2022 ?CLINICAL DATA:  Carotid dissection suspected EXAM: CT ANGIOGRAPHY HEAD AND NECK TECHNIQUE: Multidetector CT imaging of the head and neck was performed using the standard protocol during bolus administration of intravenous contrast. Multiplanar CT image reconstructions and MIPs were obtained to evaluate the vascular anatomy. Carotid stenosis measurements (when applicable) are obtained utilizing NASCET criteria, using the distal internal carotid diameter as the denominator. RADIATION DOSE REDUCTION: This exam was performed according to the departmental dose-optimization program which includes automated exposure control, adjustment of the mA and/or kV according to patient size and/or use of iterative reconstruction technique. CONTRAST:  131m OMNIPAQUE IOHEXOL 350 MG/ML SOLN COMPARISON:  None. FINDINGS: CTA NECK FINDINGS SKELETON: There is no bony spinal canal stenosis. No lytic or blastic lesion. OTHER NECK: Normal pharynx, larynx and major salivary glands. No  cervical lymphadenopathy. Unremarkable thyroid gland. UPPER CHEST: No pneumothorax or pleural effusion. No nodules or masses. AORTIC ARCH: There is no calcific atherosclerosis of the aortic arch. There is no aneurysm, dissection or hemodynamically significant stenosis of the visualized portion of the aorta. Conventional 3 vessel aortic branching pattern. The visualized proximal subclavian arteries are widely patent. RIGHT CAROTID SYSTEM: Normal without aneurysm, dissection or stenosis. LEFT CAROTID SYSTEM: Normal without aneurysm, dissection or stenosis. VERTEBRAL ARTERIES: Left dominant configuration. Both origins are clearly patent. There is no dissection, occlusion or flow-limiting stenosis to the skull base (V1-V3 segments). CTA HEAD FINDINGS POSTERIOR CIRCULATION: --Vertebral arteries: Normal V4 segments. --Inferior cerebellar arteries: Normal. --Basilar artery: Normal. --Superior cerebellar arteries: Normal. --Posterior cerebral arteries (PCA): Normal. ANTERIOR CIRCULATION: --Intracranial internal carotid arteries: Normal. --Anterior cerebral arteries (ACA): Normal. Both A1 segments are present. Patent anterior communicating artery (a-comm). --Middle cerebral arteries (MCA): Normal. VENOUS SINUSES: As permitted by contrast timing, patent. ANATOMIC VARIANTS: None Review of the MIP images confirms the above findings. IMPRESSION: Normal CTA of the head and neck. Electronically Signed  By: Ulyses Jarred M.D.   On: 04/11/2022 03:04  ? ?CT Angio Neck W and/or Wo Contrast ? ?Result Date: 04/11/2022 ?CLINICAL DATA:  Carotid dissection suspected EXAM: CT ANGIOGRAPHY HEAD AND NECK TECHNIQUE: Multidetector CT imaging of the head and neck was performed using the standard protocol during bolus administration of intravenous contrast. Multiplanar CT image reconstructions and MIPs were obtained to evaluate the vascular anatomy. Carotid stenosis measurements (when applicable) are obtained utilizing NASCET criteria, using the  distal internal carotid diameter as the denominator. RADIATION DOSE REDUCTION: This exam was performed according to the departmental dose-optimization program which includes automated exposure control, adjustment of the

## 2022-04-11 NOTE — Discharge Instructions (Signed)
? ?  RETURN IMMEDIATELY IF you develop a sudden, severe headache or confusion, become poorly responsive or faint, develop a fever above 100.47F or problem breathing, have a change in speech, vision, swallowing, or understanding, or develop new weakness, numbness, tingling, incoordination, or have a seizure. ? ?

## 2022-05-28 IMAGING — CT CT HEAD CODE STROKE
3 of 4 series · 15 of 47 positions shown, 18 images · non-contrast
Comparison: None.

CLINICAL DATA: Code stroke.  Acute neurologic deficit



[Series 3: head wo · axial · 0.43mm/px · z∈[+1250,+1370]mm · 9 of 30 slices shown, 12 images]
[im 3/30  brain]
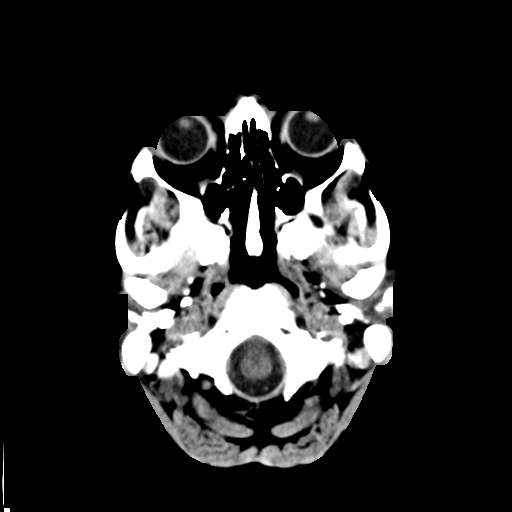
[im 3/30  bone]
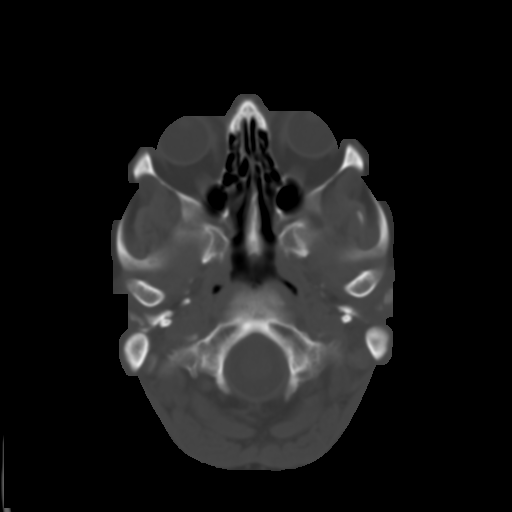
[im 6/30  brain]
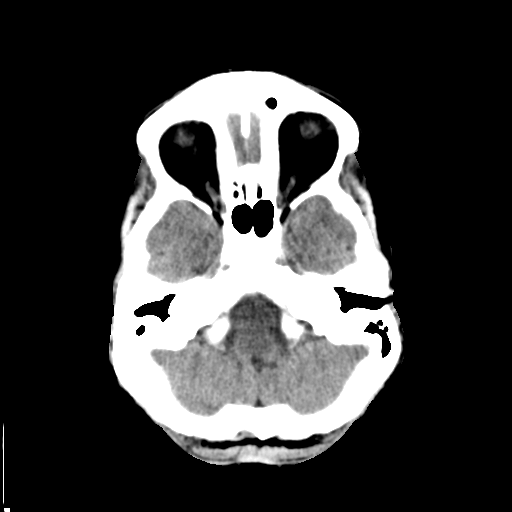
[im 9/30  brain]
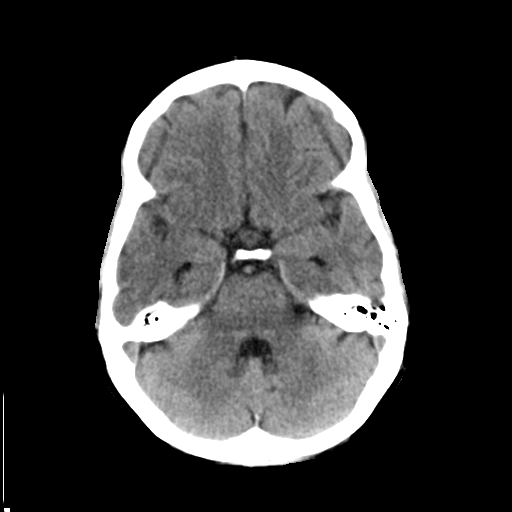
[im 12/30  brain]
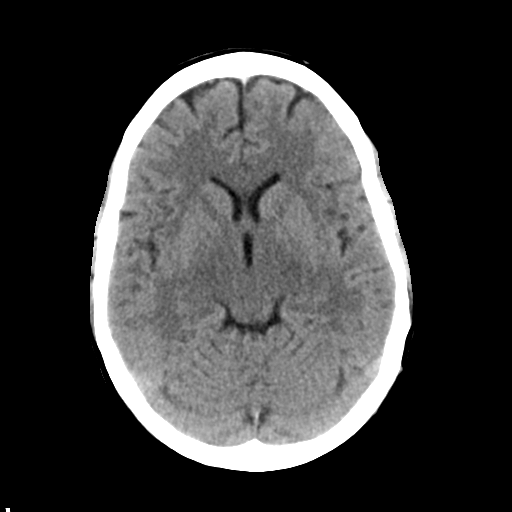
[im 15/30  brain]
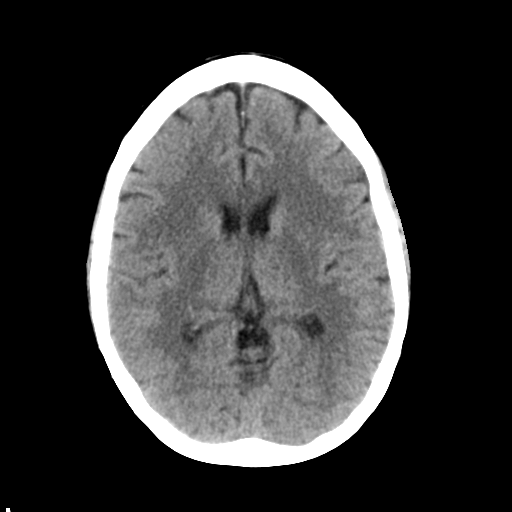
[im 15/30  bone]
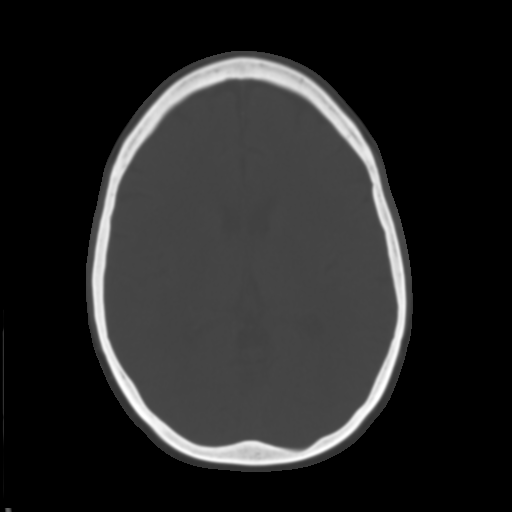
[im 18/30  brain]
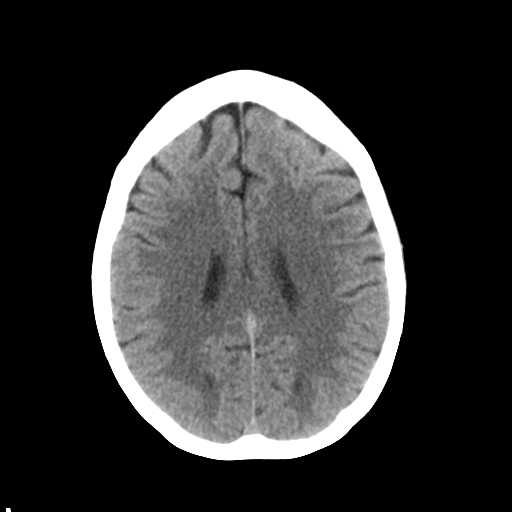
[im 21/30  brain]
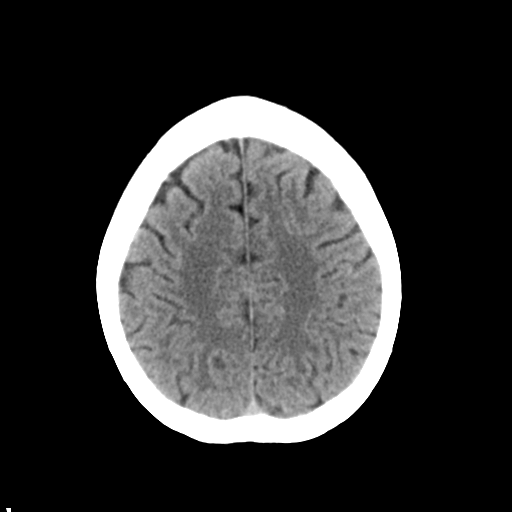
[im 24/30  brain]
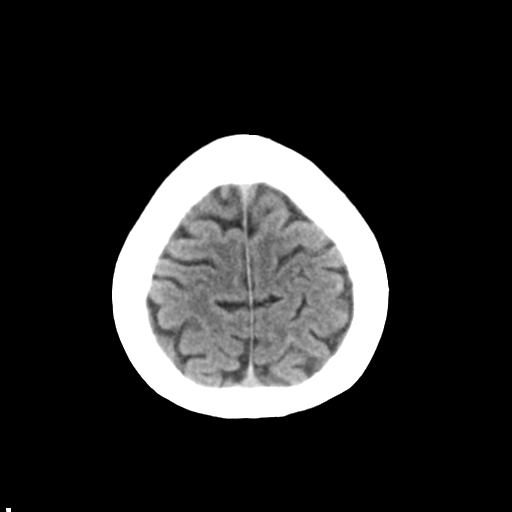
[im 27/30  brain]
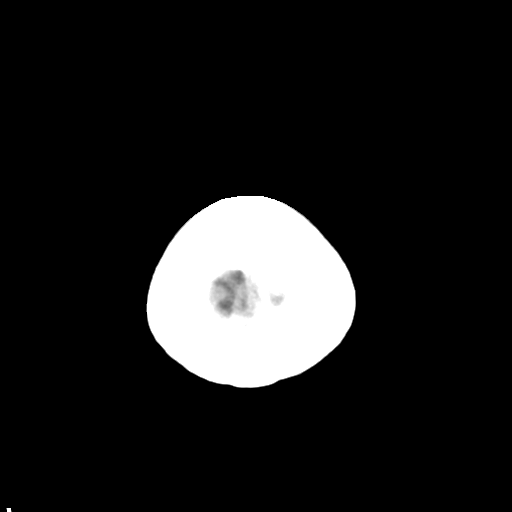
[im 27/30  bone]
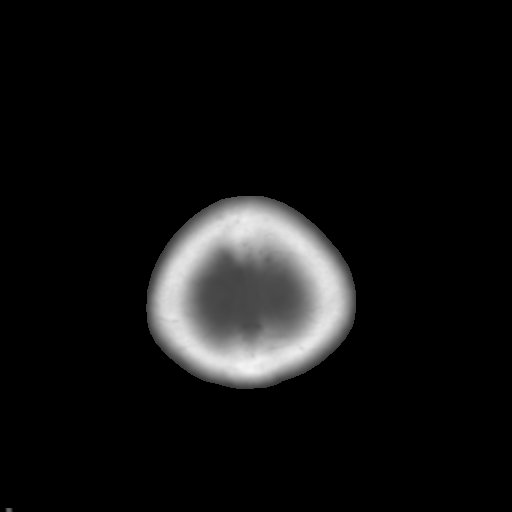

[Series 5: coronal soft tissue · coronal · 0.30mm/px · 3 of 66 slices shown]
[im 22/66  brain]
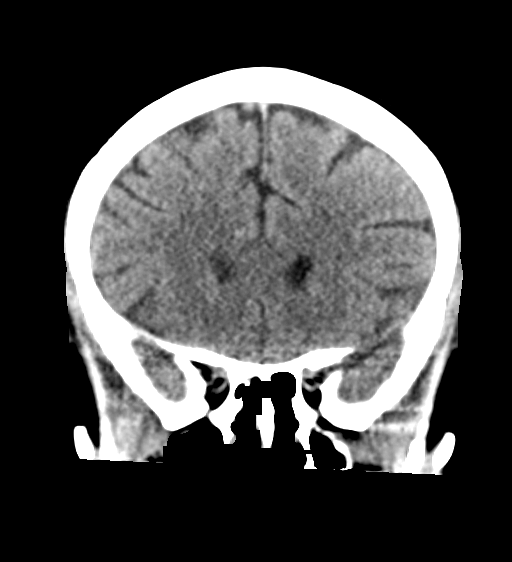
[im 29/66  brain]
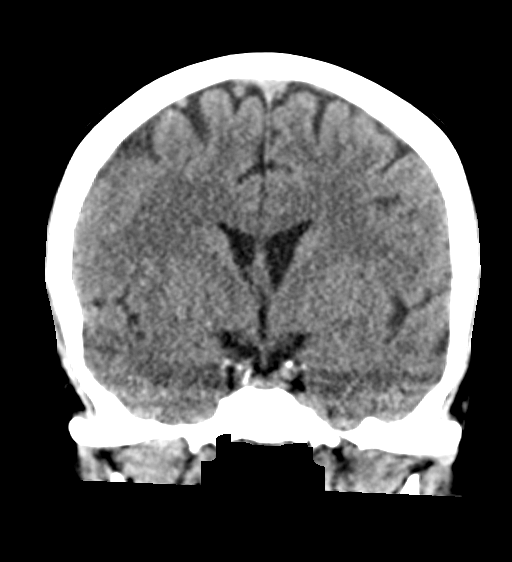
[im 37/66  brain]
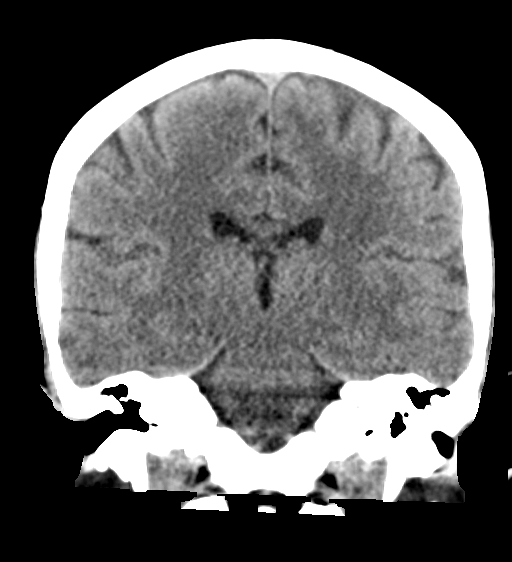

[Series 6: sagittal soft tissue · sagittal · 0.35mm/px · 3 of 52 slices shown]
[im 18/52  brain]
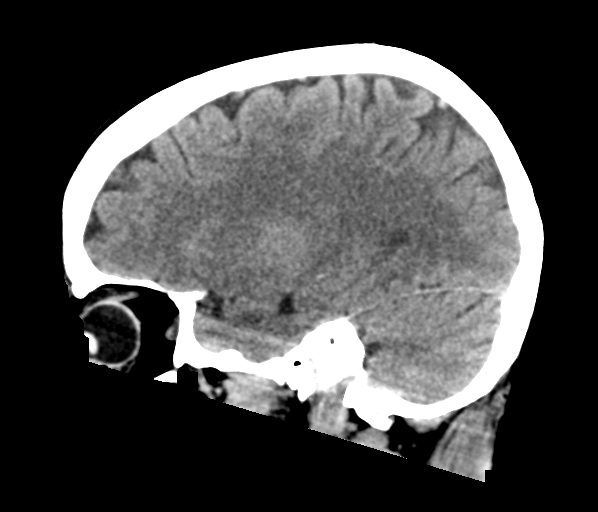
[im 26/52  brain]
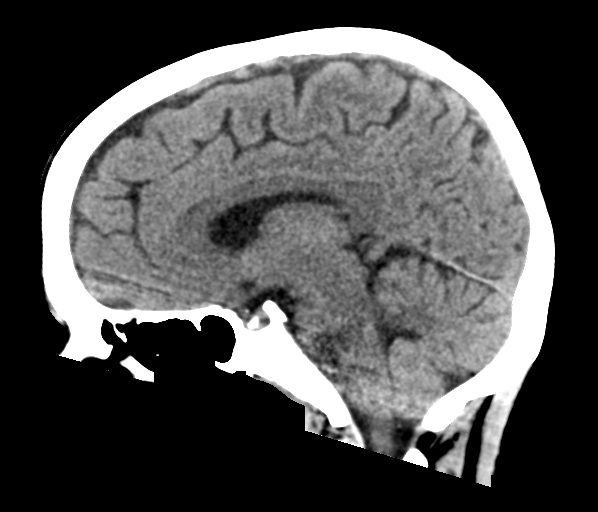
[im 35/52  brain]
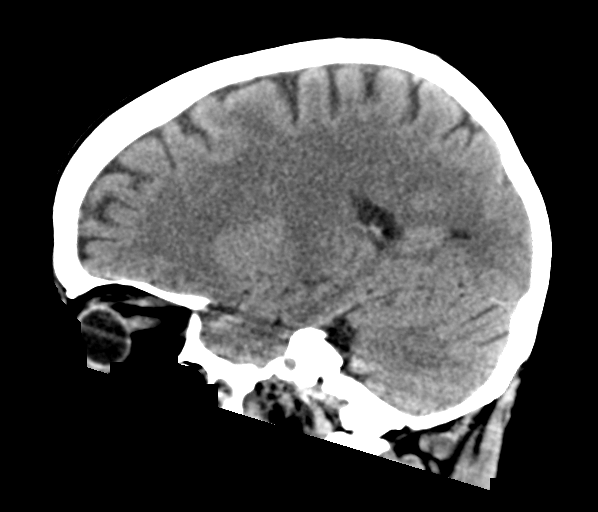

[15 of 47 positions shown; findings below may reference images not displayed]

FINDINGS: Brain: There is no mass, hemorrhage or extra-axial collection. The
size and configuration of the ventricles and extra-axial CSF spaces
are normal. The brain parenchyma is normal, without evidence of
acute or chronic infarction.

Vascular: No abnormal hyperdensity of the major intracranial
arteries or dural venous sinuses. No intracranial atherosclerosis.

Skull: The visualized skull base, calvarium and extracranial soft
tissues are normal.

Sinuses/Orbits: No fluid levels or advanced mucosal thickening of
the visualized paranasal sinuses. No mastoid or middle ear effusion.
The orbits are normal.

ASPECTS (Alberta Stroke Program Early CT Score)

- Ganglionic level infarction (caudate, lentiform nuclei, internal
capsule, insula, M1-M3 cortex): 7

- Supraganglionic infarction (M4-M6 cortex): 3

Total score (0-10 with 10 being normal): 10
IMPRESSION: 1. Normal head CT.
2. ASPECTS is 10.

These results were called by telephone at the time of interpretation
acknowledged these results.

## 2022-09-01 ENCOUNTER — Ambulatory Visit
Admission: EM | Admit: 2022-09-01 | Discharge: 2022-09-01 | Disposition: A | Discharge status: Home / Self Care | Payer: Self-pay | Attending: Emergency Medicine | Admitting: Emergency Medicine

## 2022-09-01 DIAGNOSIS — Z20822 Contact with and (suspected) exposure to covid-19: Secondary | ICD-10-CM | POA: Insufficient documentation

## 2022-09-01 DIAGNOSIS — J069 Acute upper respiratory infection, unspecified: Secondary | ICD-10-CM | POA: Insufficient documentation

## 2022-09-01 LAB — RESP PANEL BY RT-PCR (FLU A&B, COVID) ARPGX2
Influenza A by PCR: NEGATIVE
Influenza B by PCR: NEGATIVE
SARS Coronavirus 2 by RT PCR: NEGATIVE

## 2022-09-01 NOTE — ED Provider Notes (Signed)
[facility]    CSN: [id number] Arrival date & time: 09/01/22  1300    HISTORY   Chief Complaint  Patient presents with   Dizziness   Generalized Body Aches   Fatigue   HPI Alice Greene is a pleasant, 46 y.o. female who presents to urgent care today. Patient complains of chills, dizziness, fatigue, headache, body aches, nasal congestion, rhinorrhea, sinus pressure since yesterday.  Patient states she took some ibuprofen around noon which seemed to help.  Patient has normal vital signs on arrival and appears to be in no acute distress.  Patient denies loss of taste or smell, fever, known sick contacts, nausea, vomiting, diarrhea, headache, sore throat, cough, otalgia.  The history is provided by the patient.   Past Medical History:  Diagnosis Date   Blood transfusion without reported diagnosis    Diverticulosis    Thyroid disease    There are no problems to display for this patient.  Past Surgical History:  Procedure Laterality Date   CHOLECYSTECTOMY     OB History   No obstetric history on file.    Home Medications    Prior to Admission medications   Not on File    Family History History reviewed. No pertinent family history. Social History Social History   Tobacco Use   Smoking status: Former   Smokeless tobacco: Never  Scientific laboratory technician Use: Every day   Substances: Nicotine, Flavoring  Substance Use Topics   Alcohol use: Not Currently   Drug use: Never   Allergies   Hydrocodone, Propoxyphene, and Codeine  Review of Systems Review of Systems Pertinent findings revealed after performing a 14 point review of systems has been noted in the history of present illness.  Physical Exam Triage Vital Signs ED Triage Vitals  Enc Vitals Group     BP 10/15/21 0827 (!) 147/82     Pulse Rate 10/15/21 0827 72     Resp 10/15/21 0827 18     Temp 10/15/21 0827 98.3 F (36.8 C)     Temp Source 10/15/21 0827 Oral     SpO2 10/15/21 0827 98 %      Weight --      Height --      Head Circumference --      Peak Flow --      Pain Score 10/15/21 0826 5     Pain Loc --      Pain Edu? --      Excl. in [place]? --   No data found.  Updated Vital Signs BP 134/88 (BP Location: Right Arm)   Pulse 73   Temp 98.1 F (36.7 C) (Oral)   Resp 18   LMP 08/29/2022   SpO2 97%   Physical Exam Vitals and nursing note reviewed.  Constitutional:      General: She is not in acute distress.    Appearance: Normal appearance. She is not ill-appearing.  HENT:     Head: Normocephalic and atraumatic.     Salivary Glands: Right salivary gland is not diffusely enlarged or tender. Left salivary gland is not diffusely enlarged or tender.     Right Ear: Tympanic membrane, ear canal and external ear normal. No drainage. No middle ear effusion. There is no impacted cerumen. Tympanic membrane is not erythematous or bulging.     Left Ear: Tympanic membrane, ear canal and external ear normal. No drainage.  No middle ear effusion. There is no impacted cerumen. Tympanic membrane is not  erythematous or bulging.     Nose: Nose normal. No nasal deformity, septal deviation, mucosal edema, congestion or rhinorrhea.     Right Turbinates: Not enlarged, swollen or pale.     Left Turbinates: Not enlarged, swollen or pale.     Right Sinus: No maxillary sinus tenderness or frontal sinus tenderness.     Left Sinus: No maxillary sinus tenderness or frontal sinus tenderness.     Mouth/Throat:     Lips: Pink. No lesions.     Mouth: Mucous membranes are moist. No oral lesions.     Pharynx: Oropharynx is clear. Uvula midline. No posterior oropharyngeal erythema or uvula swelling.     Tonsils: No tonsillar exudate. 0 on the right. 0 on the left.  Eyes:     General: Lids are normal.        Right eye: No discharge.        Left eye: No discharge.     Extraocular Movements: Extraocular movements intact.     Conjunctiva/sclera: Conjunctivae normal.     Right eye: Right  conjunctiva is not injected.     Left eye: Left conjunctiva is not injected.  Neck:     Trachea: Trachea and phonation normal.  Cardiovascular:     Rate and Rhythm: Normal rate and regular rhythm.     Pulses: Normal pulses.     Heart sounds: Normal heart sounds. No murmur heard.    No friction rub. No gallop.  Pulmonary:     Effort: Pulmonary effort is normal. No accessory muscle usage, prolonged expiration or respiratory distress.     Breath sounds: Normal breath sounds. No stridor, decreased air movement or transmitted upper airway sounds. No decreased breath sounds, wheezing, rhonchi or rales.  Chest:     Chest wall: No tenderness.  Musculoskeletal:        General: Normal range of motion.     Cervical back: Normal range of motion and neck supple. Normal range of motion.  Lymphadenopathy:     Cervical: No cervical adenopathy.  Skin:    General: Skin is warm and dry.     Findings: No erythema or rash.  Neurological:     General: No focal deficit present.     Mental Status: She is alert and oriented to person, place, and time.  Psychiatric:        Mood and Affect: Mood normal.        Behavior: Behavior normal.     Visual Acuity Right Eye Distance:   Left Eye Distance:   Bilateral Distance:    Right Eye Near:   Left Eye Near:    Bilateral Near:     [facility] Couse / Diagnostics / Procedures:     Radiology No results found.  Procedures Procedures (including critical care time) EKG  Pending results:  Labs Reviewed  RESP PANEL BY RT-PCR (FLU A&B, COVID) ARPGX2    Medications Ordered in UC: Medications - No data to display  UC Diagnoses / Final Clinical Impressions(s)   I have reviewed the triage vital signs and the nursing notes.  Pertinent labs & imaging results that were available during my care of the patient were reviewed by me and considered in my medical decision making (see chart for details).    Final diagnoses:  Viral upper respiratory illness    COVID-19 and influenza PCR test pending, patient will be notified of results once complete.  Antibiotics not indicated due to lack of physical exam findings consistent with bacterial infection.  Conservative care recommended supportive medications advised.  Return precautions advised.  ED Prescriptions   None    PDMP not reviewed this encounter.  Disposition Upon Discharge:  Condition: stable for discharge home Home: take medications as prescribed; routine discharge instructions as discussed; follow up as advised.  Patient presented with an acute illness with associated systemic symptoms and significant discomfort requiring urgent management. In my opinion, this is a condition that a prudent lay person (someone who possesses an average knowledge of health and medicine) may potentially expect to result in complications if not addressed urgently such as respiratory distress, impairment of bodily function or dysfunction of bodily organs.   Routine symptom specific, illness specific and/or disease specific instructions were discussed with the patient and/or caregiver at length.   As such, the patient has been evaluated and assessed, work-up was performed and treatment was provided in alignment with urgent care protocols and evidence based medicine.  Patient/parent/caregiver has been advised that the patient may require follow up for further testing and treatment if the symptoms continue in spite of treatment, as clinically indicated and appropriate.  If the patient was tested for COVID-19, Influenza and/or RSV, then the patient/parent/guardian was advised to isolate at home pending the results of his/her diagnostic coronavirus test and potentially longer if they're positive. I have also advised pt that if his/her COVID-19 test returns positive, it's recommended to self-isolate for at least 10 days after symptoms first appeared AND until fever-free for 24 hours without fever reducer AND other  symptoms have improved or resolved. Discussed self-isolation recommendations as well as instructions for household member/close contacts as per the [facility] and [place] DHHS, and also gave patient the [place] packet with this information.  Patient/parent/caregiver has been advised to return to the [facility] or PCP in 3-5 days if no better; to PCP or the Emergency Department if new signs and symptoms develop, or if the current signs or symptoms continue to change or worsen for further workup, evaluation and treatment as clinically indicated and appropriate  The patient will follow up with their current PCP if and as advised. If the patient does not currently have a PCP we will assist them in obtaining one.   The patient may need specialty follow up if the symptoms continue, in spite of conservative treatment and management, for further workup, evaluation, consultation and treatment as clinically indicated and appropriate.  Patient/parent/caregiver verbalized understanding and agreement of plan as discussed.  All questions were addressed during visit.  Please see discharge instructions below for further details of plan.  Discharge Instructions:   Discharge Instructions      The symptoms that you described to me along with my physical exam findings are concerning for a viral upper respiratory tract infection.  The result of your viral COVID-19 and influenza PCR testing will be posted to your MyChart once it is complete, typically this takes 6 to 12 hours.    If your COVID-19 PCR test is positive, you will be contacted by phone.  Because you do not have a history of being immune compromised, you are currently vaccinated for COVID-19, you are under the age of 73, you do not have a risk of severe disease due to COVID-19, antiviral treatment is not indicated.   If your influenza PCR test is positive, please discuss with the callback nurse whether or not you would benefit from antiviral therapy for influenza.    If  both your COVID-19 and influenza PCR tests are negative, then your illness is  likely due to one of the many less serious illnesses that are circulating in our community right now.  Conservative care is recommended.  Remain at home until you are fever free for 24 hours without the use of antifever medications such as Tylenol and ibuprofen.   Based on my physical exam findings and the history you have provided  today, I do not recommend antibiotics at this time.  I do not believe the risks and side effects of antibiotics would outweigh any minimal benefit that they might provide.       Please see the list below for recommended medications, dosages and frequencies to provide relief of your current symptoms:     Advil, Motrin (ibuprofen): This is a good anti-inflammatory medication which addresses aches, pains and inflammation of the upper airways that causes sinus and nasal congestion as well as in the lower airways which makes your cough feel tight and sometimes burn.  I recommend that you take between 400 to 600 mg every 6-8 hours as needed.      Tylenol (acetaminophen): This is a good fever reducer.  If your body temperature rises above 101.5 as measured with a thermometer, it is recommended that you take 1,000 mg every 8 hours until your temperature falls below 101.5, please not take more than 3,000 mg of acetaminophen either as a separate medication or as in ingredient in an over-the-counter cold/flu preparation within a 24-hour period.      Sudafed (pseudoephedrine): This is a decongestant.  This medication has to be purchased from the pharmacist counter, I recommend taking 2 tablets, 60 mg, 2-3 times a day as needed to relieve runny nose and sinus drainage.  This medication is available over-the-counter however I have sent a prescription for your convenience.   Robitussin, Mucinex (guaifenesin): This is an expectorant.  This helps break up chest congestion and loosen up thick nasal drainage making  phlegm and drainage more liquid and therefore easier to remove.  I recommend being 400 mg three times daily as needed.      Please follow-up within the next 5-7 days either with your primary care provider or urgent care if your symptoms do not resolve.  If you do not have a primary care provider, we will assist you in finding one.   Thank you for visiting urgent care today.  We appreciate the opportunity to participate in your care.       This office note has been dictated using Museum/gallery curator.  Unfortunately, this method of dictation can sometimes lead to typographical or grammatical errors.  I apologize for your inconvenience in advance if this occurs.  Please do not hesitate to reach out to me if clarification is needed.      Lynden Oxford Scales, PA-C 09/01/22 1451

## 2022-09-01 NOTE — ED Triage Notes (Signed)
Pt c/o chills, dizziness, fatigue, and body aches.   Started: yesterday  Home interventions: Ibuprofen around 12pm

## 2022-09-01 NOTE — Discharge Instructions (Signed)
The symptoms that you described to me along with my physical exam findings are concerning for a viral upper respiratory tract infection.  The result of your viral COVID-19 and influenza PCR testing will be posted to your MyChart once it is complete, typically this takes 6 to 12 hours.    If your COVID-19 PCR test is positive, you will be contacted by phone.  Because you do not have a history of being immune compromised, you are currently vaccinated for COVID-19, you are under the age of 89, you do not have a risk of severe disease due to COVID-19, antiviral treatment is not indicated.   If your influenza PCR test is positive, please discuss with the callback nurse whether or not you would benefit from antiviral therapy for influenza.    If both your COVID-19 and influenza PCR tests are negative, then your illness is likely due to one of the many less serious illnesses that are circulating in our community right now.  Conservative care is recommended.  Remain at home until you are fever free for 24 hours without the use of antifever medications such as Tylenol and ibuprofen.   Based on my physical exam findings and the history you have provided  today, I do not recommend antibiotics at this time.  I do not believe the risks and side effects of antibiotics would outweigh any minimal benefit that they might provide.       Please see the list below for recommended medications, dosages and frequencies to provide relief of your current symptoms:     Advil, Motrin (ibuprofen): This is a good anti-inflammatory medication which addresses aches, pains and inflammation of the upper airways that causes sinus and nasal congestion as well as in the lower airways which makes your cough feel tight and sometimes burn.  I recommend that you take between 400 to 600 mg every 6-8 hours as needed.      Tylenol (acetaminophen): This is a good fever reducer.  If your body temperature rises above 101.5 as measured with a  thermometer, it is recommended that you take 1,000 mg every 8 hours until your temperature falls below 101.5, please not take more than 3,000 mg of acetaminophen either as a separate medication or as in ingredient in an over-the-counter cold/flu preparation within a 24-hour period.      Sudafed (pseudoephedrine): This is a decongestant.  This medication has to be purchased from the pharmacist counter, I recommend taking 2 tablets, 60 mg, 2-3 times a day as needed to relieve runny nose and sinus drainage.  This medication is available over-the-counter however I have sent a prescription for your convenience.   Robitussin, Mucinex (guaifenesin): This is an expectorant.  This helps break up chest congestion and loosen up thick nasal drainage making phlegm and drainage more liquid and therefore easier to remove.  I recommend being 400 mg three times daily as needed.      Please follow-up within the next 5-7 days either with your primary care provider or urgent care if your symptoms do not resolve.  If you do not have a primary care provider, we will assist you in finding one.   Thank you for visiting urgent care today.  We appreciate the opportunity to participate in your care.

## 2022-11-28 ENCOUNTER — Other Ambulatory Visit: Payer: Self-pay

## 2022-11-28 ENCOUNTER — Encounter (HOSPITAL_COMMUNITY): Payer: Self-pay

## 2022-11-28 ENCOUNTER — Emergency Department (HOSPITAL_COMMUNITY)
Admission: EM | Admit: 2022-11-28 | Discharge: 2022-11-28 | Disposition: A | Discharge status: Home / Self Care | Payer: 59 | Attending: Emergency Medicine | Admitting: Emergency Medicine

## 2022-11-28 ENCOUNTER — Emergency Department (HOSPITAL_COMMUNITY): Payer: 59

## 2022-11-28 DIAGNOSIS — M79641 Pain in right hand: Secondary | ICD-10-CM | POA: Diagnosis not present

## 2022-11-28 MED ORDER — ACETAMINOPHEN 500 MG PO TABS: 500.0000 mg | ORAL_TABLET | Freq: Four times a day (QID) | ORAL | 0 refills | Status: AC | PRN

## 2022-11-28 MED ORDER — IBUPROFEN 800 MG PO TABS: 800.0000 mg | ORAL_TABLET | Freq: Three times a day (TID) | ORAL | 0 refills | Status: AC

## 2022-11-28 NOTE — Discharge Instructions (Addendum)
You came to the emergency department due to swelling in your hand.  Your x-rays showed some degenerative disease but no fractures.  You may use your brace when you are working however at home keep it off and ice your hand.  I have sent ibuprofen and Tylenol to your pharmacy.  You may alternate these safely.  There are 2  offices on these papers for you to call to establish care with a primary care doctor.  You may also call anyone else of your choosing.  It was a pleasure to meet you and we hope you feel better!

## 2022-11-28 NOTE — ED Provider Notes (Signed)
[facility] DEPT Provider Note   CSN: [id number] Arrival date & time: 11/28/22  1521     History  Chief Complaint  Patient presents with   Hand Pain    Alice Greene is a 45 y.o. female presenting today due to right hand pain.  She says it has been going on for about 3 to 5 days.  Localizes it to her right thumb.  Denies any falls or trauma.  She was seen 2 days ago at urgent care and they put her in a brace and said that she had sprained her thumb.  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  She says that she feels as though her fingers are swelling and 800 mg ibuprofen is not taking away her pain.  Hand Pain       Home Medications Prior to Admission medications   Medication Sig Start Date End Date Taking? Authorizing Provider  acetaminophen (TYLENOL) 500 MG tablet Take 1 tablet (500 mg total) by mouth every 6 (six) hours as needed. 11/28/22  Yes Gelisa Tieken A, PA-C  ibuprofen (ADVIL) 800 MG tablet Take 1 tablet (800 mg total) by mouth 3 (three) times daily. 11/28/22  Yes Richele Strand A, PA-C      Allergies    Hydrocodone, Propoxyphene, and Codeine    Review of Systems   Review of Systems  Physical Exam Updated Vital Signs BP (!) 150/88 (BP Location: Left Arm)   Pulse 80   Temp 98.5 F (36.9 C) (Oral)   Resp 17   LMP 11/15/2022 (Approximate)   SpO2 100%  Physical Exam Vitals and nursing note reviewed.  Constitutional:      Appearance: Normal appearance.  HENT:     Head: Normocephalic and atraumatic.  Eyes:     General: No scleral icterus.    Conjunctiva/sclera: Conjunctivae normal.  Pulmonary:     Effort: Pulmonary effort is normal. No respiratory distress.  Musculoskeletal:     Comments: Full range of motion of all MTPs, PIPs and DIPs.  Normal cap refill in all the digits.  Does appear to have some soft tissue swelling in the right hand and digits.  Wrong radial pulse.   Skin:    Findings: No rash.  Neurological:     Mental Status: She is alert.  Psychiatric:        Mood and Affect: Mood normal.     ED Results / Procedures / Treatments   Labs (all labs ordered are listed, but only abnormal results are displayed) Labs Reviewed - No data to display  EKG None  Radiology DG Wrist Complete Right  Result Date: 11/28/2022 CLINICAL DATA:  Fall EXAM: RIGHT WRIST - COMPLETE 3+ VIEW; RIGHT HAND - COMPLETE 3+ VIEW COMPARISON:  None Available. FINDINGS: Right hand: No acute fracture or dislocation. There are moderate degenerative changes of the first carpometacarpal joint with joint space narrowing and sclerosis. Soft tissues are within normal limits. Right wrist: There is no evidence of fracture or dislocation. There is no evidence of arthropathy or other focal bone abnormality. Soft tissues are unremarkable. IMPRESSION: 1. No acute fracture or dislocation of the right hand or wrist. 2. Moderate degenerative changes of the first carpometacarpal joint. Electronically Signed   By: Ronney Asters M.D.   On: 11/28/2022 16:08   DG Hand Complete Right  Result Date: 11/28/2022 CLINICAL DATA:  Fall EXAM: RIGHT WRIST - COMPLETE 3+ VIEW; RIGHT HAND - COMPLETE  3+ VIEW COMPARISON:  None Available. FINDINGS: Right hand: No acute fracture or dislocation. There are moderate degenerative changes of the first carpometacarpal joint with joint space narrowing and sclerosis. Soft tissues are within normal limits. Right wrist: There is no evidence of fracture or dislocation. There is no evidence of arthropathy or other focal bone abnormality. Soft tissues are unremarkable. IMPRESSION: 1. No acute fracture or dislocation of the right hand or wrist. 2. Moderate degenerative changes of the first carpometacarpal joint. Electronically Signed   By: Ronney Asters M.D.   On: 11/28/2022 16:08    Procedures Procedures   Medications Ordered in ED Medications - No data to display  ED Course/  Medical Decision Making/ A&P                           Medical Decision Making Amount and/or Complexity of Data Reviewed Radiology: ordered.  Risk OTC drugs. Prescription drug management.   46 year old female presenting today with right hand pain.  No known trauma.  Was seen at urgent care and told that she had a thumb sprain.  She returns today with increased tenderness and swollen fingers.  Physical exam revealing of soft tissue swelling.  No x-rays were done in urgent care.     X-ray ordered, reviewed and interpreted by me.  I agree with radiology that there are no fractures.  She does appear to have some degeneration in her first MCP joint  I originally triaged this patient.  She was given the option to wait for an evaluation in the back of the department.  She said that if her x-rays are normal she would be agreeable to a fast-track.  She was given instructions for at home care.  I also suspect some of her swelling is secondary to her brace being too tight.  She will be given extra strength Tylenol as well as the ibuprofen she has been taking.  We discussed ice rotations as well.  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  Discharged with strict return precautions and referrals to PCP   Final Clinical Impression(s) / ED Diagnoses Final diagnoses:  Right hand pain    Rx / DC Orders ED Discharge Orders          Ordered    ibuprofen (ADVIL) 800 MG tablet  3 times daily        11/28/22 1810    acetaminophen (TYLENOL) 500 MG tablet  Every 6 hours PRN        11/28/22 1810           Results and diagnoses were explained to the patient. Return precautions discussed in full. Patient had no additional questions and expressed complete understanding.   This chart was dictated using voice recognition software.  Despite best efforts to proofread,  errors can occur which can change the documentation meaning.     Rhae Hammock, PA-C 11/28/22 1837    Malvin Johns, MD 11/28/22 2340

## 2022-11-28 NOTE — ED Triage Notes (Addendum)
Patient said she was at work and then noticed a burning pain in her right hand to wrist. Worsened and noticed hand swelling.

## 2022-11-28 NOTE — ED Provider Triage Note (Signed)
Emergency Medicine Provider Triage Evaluation Note  Alice Greene , a 46 y.o. female  was evaluated in triage.  Pt complains of pain to her left hand.  She reports that this started 3 days ago and was seen in urgent care and they put her in a brace but now she is having increased pain and swelling of her fingers.  No numbness or tingling.  Still has full range of motion.  Review of Systems  Positive:  Negative:   Physical Exam  BP (!) 150/89   Pulse 77   Temp 98.4 F (36.9 C) (Oral)   Resp 18   SpO2 98%  Gen:   Awake, no distress   Resp:  Normal effort  MSK:   Moves extremities without difficulty  Other:  Soft tissue swelling and patient's digits and over patient's distal radius.  TTP.  Strong radial pulse  Medical Decision Making  Medically screening exam initiated at 3:49 PM.  Appropriate orders placed.  Alice Greene was informed that the remainder of the evaluation will be completed by another provider, this initial triage assessment does not replace that evaluation, and the importance of remaining in the ED until their evaluation is complete.     Rhae Hammock, PA-C 11/28/22 1551
